# Patient Record
Sex: Male | Born: 1975 | Race: Asian | Hispanic: No | Marital: Married | Smoking: Never smoker
Health system: Southern US, Community
[De-identification: ages and names within clinical notes are randomized; demographics above are authoritative.]

## PROBLEM LIST (undated history)

## (undated) DIAGNOSIS — E785 Hyperlipidemia, unspecified: Secondary | ICD-10-CM

## (undated) DIAGNOSIS — E119 Type 2 diabetes mellitus without complications: Secondary | ICD-10-CM

## (undated) DIAGNOSIS — I10 Essential (primary) hypertension: Secondary | ICD-10-CM

---

## 2001-02-11 ENCOUNTER — Encounter: Admission: RE | Admit: 2001-02-11 | Discharge: 2001-02-11 | Payer: Self-pay | Admitting: Family Medicine

## 2001-02-25 ENCOUNTER — Encounter: Admission: RE | Admit: 2001-02-25 | Discharge: 2001-02-25 | Payer: Self-pay | Admitting: Family Medicine

## 2001-03-25 ENCOUNTER — Encounter: Admission: RE | Admit: 2001-03-25 | Discharge: 2001-03-25 | Payer: Self-pay | Admitting: Family Medicine

## 2006-08-06 ENCOUNTER — Encounter (INDEPENDENT_AMBULATORY_CARE_PROVIDER_SITE_OTHER): Payer: Self-pay | Admitting: *Deleted

## 2007-07-15 ENCOUNTER — Ambulatory Visit: Payer: Self-pay | Admitting: Family Medicine

## 2007-07-15 DIAGNOSIS — F329 Major depressive disorder, single episode, unspecified: Secondary | ICD-10-CM | POA: Insufficient documentation

## 2007-07-15 DIAGNOSIS — R030 Elevated blood-pressure reading, without diagnosis of hypertension: Secondary | ICD-10-CM | POA: Insufficient documentation

## 2007-07-15 DIAGNOSIS — E669 Obesity, unspecified: Secondary | ICD-10-CM | POA: Insufficient documentation

## 2007-07-15 DIAGNOSIS — A630 Anogenital (venereal) warts: Secondary | ICD-10-CM | POA: Insufficient documentation

## 2007-07-15 DIAGNOSIS — B078 Other viral warts: Secondary | ICD-10-CM | POA: Insufficient documentation

## 2007-07-15 LAB — CONVERTED CEMR LAB
Bilirubin Urine: NEGATIVE
Ketones, urine, test strip: NEGATIVE
Specific Gravity, Urine: 1.025
pH: 6

## 2007-08-05 ENCOUNTER — Ambulatory Visit: Payer: Self-pay | Admitting: Family Medicine

## 2007-08-05 DIAGNOSIS — R5383 Other fatigue: Secondary | ICD-10-CM

## 2007-08-05 DIAGNOSIS — K219 Gastro-esophageal reflux disease without esophagitis: Secondary | ICD-10-CM | POA: Insufficient documentation

## 2007-08-05 DIAGNOSIS — R5381 Other malaise: Secondary | ICD-10-CM | POA: Insufficient documentation

## 2007-08-05 LAB — CONVERTED CEMR LAB
ALT: 155 units/L — ABNORMAL HIGH (ref 0–53)
Albumin: 4.8 g/dL (ref 3.5–5.2)
Alkaline Phosphatase: 110 units/L (ref 39–117)
CO2: 22 meq/L (ref 19–32)
Glucose, Bld: 97 mg/dL (ref 70–99)
MCHC: 34.1 g/dL (ref 30.0–36.0)
MCV: 94.1 fL (ref 78.0–100.0)
Platelets: 274 10*3/uL (ref 150–400)
Potassium: 4.3 meq/L (ref 3.5–5.3)
RBC: 4.9 M/uL (ref 4.22–5.81)
Sodium: 141 meq/L (ref 135–145)
Total Bilirubin: 0.7 mg/dL (ref 0.3–1.2)
Total Protein: 7.5 g/dL (ref 6.0–8.3)
WBC: 6.9 10*3/uL (ref 4.0–10.5)

## 2007-08-12 ENCOUNTER — Encounter: Payer: Self-pay | Admitting: Family Medicine

## 2007-09-09 ENCOUNTER — Ambulatory Visit: Payer: Self-pay | Admitting: Family Medicine

## 2007-09-09 DIAGNOSIS — R740 Nonspecific elevation of levels of transaminase and lactic acid dehydrogenase [LDH]: Secondary | ICD-10-CM

## 2007-09-09 DIAGNOSIS — R7401 Elevation of levels of liver transaminase levels: Secondary | ICD-10-CM | POA: Insufficient documentation

## 2007-09-09 LAB — CONVERTED CEMR LAB
ALT: 105 units/L — ABNORMAL HIGH (ref 0–53)
AST: 43 units/L — ABNORMAL HIGH (ref 0–37)
Albumin: 5 g/dL (ref 3.5–5.2)
Alkaline Phosphatase: 114 units/L (ref 39–117)
HDL: 42 mg/dL (ref >39)
Hep B C IgM: NEGATIVE
Hepatitis B Surface Ag: NEGATIVE
LDL Cholesterol: 124 mg/dL — ABNORMAL HIGH (ref 0–99)
Total Bilirubin: 0.5 mg/dL (ref 0.3–1.2)
Total CHOL/HDL Ratio: 4.7
Total Protein: 7.9 g/dL (ref 6.0–8.3)

## 2007-09-12 ENCOUNTER — Encounter: Payer: Self-pay | Admitting: Family Medicine

## 2012-02-28 ENCOUNTER — Emergency Department (HOSPITAL_COMMUNITY): Payer: No Typology Code available for payment source

## 2012-02-28 ENCOUNTER — Encounter (HOSPITAL_COMMUNITY): Payer: Self-pay

## 2012-02-28 ENCOUNTER — Emergency Department (HOSPITAL_COMMUNITY)
Admission: EM | Admit: 2012-02-28 | Discharge: 2012-02-28 | Disposition: A | Discharge status: Home / Self Care | Payer: No Typology Code available for payment source | Attending: Emergency Medicine | Admitting: Emergency Medicine

## 2012-02-28 DIAGNOSIS — S161XXA Strain of muscle, fascia and tendon at neck level, initial encounter: Secondary | ICD-10-CM

## 2012-02-28 DIAGNOSIS — S139XXA Sprain of joints and ligaments of unspecified parts of neck, initial encounter: Secondary | ICD-10-CM | POA: Insufficient documentation

## 2012-02-28 DIAGNOSIS — Y9389 Activity, other specified: Secondary | ICD-10-CM | POA: Insufficient documentation

## 2012-02-28 DIAGNOSIS — S8012XA Contusion of left lower leg, initial encounter: Secondary | ICD-10-CM

## 2012-02-28 DIAGNOSIS — S8010XA Contusion of unspecified lower leg, initial encounter: Secondary | ICD-10-CM | POA: Insufficient documentation

## 2012-02-28 DIAGNOSIS — Y92410 Unspecified street and highway as the place of occurrence of the external cause: Secondary | ICD-10-CM | POA: Insufficient documentation

## 2012-02-28 MED ORDER — IBUPROFEN 400 MG PO TABS
600.0000 mg | ORAL_TABLET | Freq: Once | ORAL | Status: AC
  Administered 2012-02-28: 600 mg via ORAL
  Filled 2012-02-28: qty 1
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
## 2012-08-16 ENCOUNTER — Encounter: Payer: Self-pay | Admitting: Family Medicine

## 2012-08-16 ENCOUNTER — Ambulatory Visit (INDEPENDENT_AMBULATORY_CARE_PROVIDER_SITE_OTHER): Payer: No Typology Code available for payment source | Admitting: Family Medicine

## 2012-08-16 VITALS — BP 125/80 | HR 59 | Ht 64.0 in | Wt 197.0 lb

## 2012-08-16 DIAGNOSIS — R7401 Elevation of levels of liver transaminase levels: Secondary | ICD-10-CM

## 2012-08-16 DIAGNOSIS — E669 Obesity, unspecified: Secondary | ICD-10-CM

## 2012-08-16 DIAGNOSIS — H109 Unspecified conjunctivitis: Secondary | ICD-10-CM

## 2012-08-16 DIAGNOSIS — R7402 Elevation of levels of lactic acid dehydrogenase (LDH): Secondary | ICD-10-CM

## 2012-08-16 LAB — COMPREHENSIVE METABOLIC PANEL
Alkaline Phosphatase: 78 U/L (ref 39–117)
BUN: 12 mg/dL (ref 6–23)
Creat: 0.77 mg/dL (ref 0.50–1.35)
Glucose, Bld: 102 mg/dL — ABNORMAL HIGH (ref 70–99)
Sodium: 137 mEq/L (ref 135–145)
Total Bilirubin: 0.6 mg/dL (ref 0.3–1.2)
Total Protein: 6.9 g/dL (ref 6.0–8.3)

## 2012-08-16 LAB — LIPID PANEL
HDL: 40 mg/dL (ref >39)
LDL Cholesterol: 110 mg/dL — ABNORMAL HIGH (ref 0–99)
Total CHOL/HDL Ratio: 4.8 Ratio
Triglycerides: 204 mg/dL — ABNORMAL HIGH (ref <150)
VLDL: 41 mg/dL — ABNORMAL HIGH (ref 0–40)

## 2012-08-16 NOTE — Patient Instructions (Signed)
We are going to check your lab work and I will call you if anything is abnormal.

## 2012-08-18 ENCOUNTER — Encounter: Payer: Self-pay | Admitting: Family Medicine

## 2012-08-18 DIAGNOSIS — H109 Unspecified conjunctivitis: Secondary | ICD-10-CM | POA: Insufficient documentation

## 2012-08-18 NOTE — Assessment & Plan Note (Signed)
Obtain lipid panel and CMP today.

## 2012-08-18 NOTE — Assessment & Plan Note (Signed)
Repeat LFTs today.

## 2012-08-18 NOTE — Progress Notes (Signed)
Patient ID: Allen Rose, male   DOB: Feb 03, 1975, 37 y.o.   MRN: [id number]  Chief Complaint  Patient presents with  . NP   HPI Allen Rose is a 37 y.o. male.  Patient presents as a new patient.  He denies any major concern other than right eye pain and redness for which he was seen by Dr. Gwen Pounds with ophthalmology. Symptoms have been present for 2 months. No trauma to eye. Some occasional blurred vision but other than that, no difficulty seeing. No associated headache. Some clear drainage. He has been using bromfenac ophthalmic eyedrops that were prescribed which have helped. He was told this could be a viral infection.    History reviewed. No pertinent past medical history.  History reviewed. No pertinent past surgical history.  Family History  Problem Relation Age of Onset  . Diabetes Sister     Social History History  Substance Use Topics  . Smoking status: Never Smoker   . Smokeless tobacco: Not on file  . Alcohol Use: Yes     Comment: rarely: 2-4 times per month    No Known Allergies  No current outpatient prescriptions on file.   No current facility-administered medications for this visit.    Review of Systems Review of Systems Review of Systems - General ROS: negative for - chills, fever or weight loss Ophthalmic ROS: negative for - decreased vision, double vision or otherwise: see HPI Respiratory ROS: no cough, shortness of breath, or wheezing Cardiovascular ROS: no chest pain or dyspnea on exertion, no lower extremity edema Gastrointestinal ROS: no abdominal pain, change in bowel habits, or black or bloody stools Genito-Urinary ROS: no dysuria, trouble voiding, or hematuria Musculoskeletal ROS: negative for - joint pain or joint swelling Dermatological ROS: negative for rash  Blood pressure 125/80, pulse 59, height 5\' 4"  (1.626 m), weight 197 lb (89.359 kg).  Physical Exam Physical Exam Physical Examination: General appearance - alert, well  appearing, and in no distress Mental status - alert, oriented to person, place, and time Eyes - pupils equal and reactive, extraocular movements intact RIght eye: lacrimation present with injected sclera. Normal on left.  Nose - normal and patent, no erythema, discharge or polyps Mouth - mucous membranes moist, pharynx normal without lesions Neck - supple, no significant adenopathy Chest - clear to auscultation, no wheezes, rales or rhonchi, symmetric air entry Heart - normal rate, regular rhythm, normal S1, S2, no murmurs, rubs, clicks or gallops Abdomen - soft, nontender, nondistended, no masses or organomegaly Extremities - peripheral pulses normal, no pedal edema    Assessment    See problem list    Plan    See problem list       Dennies Coate 08/18/2012, 3:59 PM

## 2012-08-18 NOTE — Assessment & Plan Note (Signed)
Right eye conjunctivitis, unclear etiology. Seen by ophthalmology but patient can no longer afford visits without insurance. Will refer him to ophthalmology through the orange card so that he may be seen.

## 2012-08-19 ENCOUNTER — Encounter: Payer: Self-pay | Admitting: Family Medicine

## 2012-08-23 ENCOUNTER — Telehealth: Payer: Self-pay | Admitting: Family Medicine

## 2012-08-23 NOTE — Telephone Encounter (Signed)
What does he needs to know?   Phong Isenberg, Darlyne Russian, CMA

## 2012-08-23 NOTE — Telephone Encounter (Signed)
Pt called in referent  To his referral  To the eyes doctor.  Marines

## 2012-08-26 NOTE — Telephone Encounter (Signed)
On last visit pt mention to DR some eyes problem and needs his referral pt has OC.  Marines

## 2012-08-26 NOTE — Telephone Encounter (Signed)
It has been faxed to [facility].  It can take 3 to 6 months before he will get an appointment.  Ileana Ladd

## 2012-08-26 NOTE — Telephone Encounter (Signed)
Lupita Leash,  I see referral placed 08/16/12, where does it stand?  Rozanna Cormany, Darlyne Russian, CMA

## 2012-08-27 NOTE — Telephone Encounter (Signed)
Thank You I will let pt know  Marines

## 2012-08-27 NOTE — Telephone Encounter (Signed)
Will fwd to [employer] to please tell pt.  See msg below.  Allen Rose, Darlyne Russian, CMA

## 2012-11-18 ENCOUNTER — Ambulatory Visit (INDEPENDENT_AMBULATORY_CARE_PROVIDER_SITE_OTHER): Payer: No Typology Code available for payment source | Admitting: *Deleted

## 2012-11-18 DIAGNOSIS — Z23 Encounter for immunization: Secondary | ICD-10-CM

## 2013-01-31 ENCOUNTER — Encounter: Payer: Self-pay | Admitting: Family Medicine

## 2013-01-31 ENCOUNTER — Ambulatory Visit (INDEPENDENT_AMBULATORY_CARE_PROVIDER_SITE_OTHER): Payer: No Typology Code available for payment source | Admitting: Family Medicine

## 2013-01-31 VITALS — BP 130/86 | HR 66 | Temp 98.3°F | Ht 64.0 in | Wt 198.0 lb

## 2013-01-31 DIAGNOSIS — K029 Dental caries, unspecified: Secondary | ICD-10-CM

## 2013-01-31 MED ORDER — IBUPROFEN 600 MG PO TABS: 600.0000 mg | ORAL_TABLET | Freq: Four times a day (QID) | ORAL | Status: DC | PRN

## 2013-01-31 MED ORDER — PENICILLIN V POTASSIUM 500 MG PO TABS: 500.0000 mg | ORAL_TABLET | Freq: Four times a day (QID) | ORAL | Status: DC

## 2013-01-31 NOTE — Patient Instructions (Signed)
You can use Ora Gel for the pain.  We will put in a referral for a dentist.   Caries dental  (Dental Caries) La caries dental es la ms comn de todas las enfermedades de la boca. Ocurre en todas las edades, pero es ms frecuente en nios y Campbell.  CMO SE DESARROLLA LA CARIES DENTAL  El proceso de caries comienza cuando las bacterias de la boca se combinan con los alimentos, (especialmente azcares y almidones) para Air cabin crew. La placa es una sustancia que se adhiere a las superficies duras de los dientes (Engineer, structural). Las bacterias de la placa producen cidos que atacan el esmalte de los dientes. Estos cidos tambin pueden atacar la superficie de la raz de un diente (cemento) si este est expuesto. Los ataques repetidos disuelven estas superficies y crean huecos en el diente (cavidades). Si no se tratan, los cidos [employer] capas del diente.  FACTORES DE RIESGO   El consumo frecuente de bebidas azucaradas.   El consumo frecuente de alimentos que contienen azcar y almidn y de aquellos que se quedan fcilmente adheridos a los dientes.   Higiene bucal deficiente.   Sequedad en la boca.   Abuso de sustancias como metanfetaminas.   Arreglos dentales en mal estado o mal hechos.   Trastornos de Psychologist, sport and exercise.   Reflujo gastroesofgico (ERGE).   Ciertos tratamientos de radiacin en la cabeza y el cuello. SNTOMAS  En las etapas tempranas de la caries dental, rara vez hay sntomas. En algunos casos pueden observarse zonas blancas, con aspecto de tiza, Campbell [employer] u otras capas del diente. En las etapas posteriores, los sntomas incluyen:   Hoyos y [employer].  Dolor en los dientes despus de consumir alimentos o bebidas dulces, calientes o fros.  Dolor alrededor del diente.  Inflamacin alrededor del diente. DIAGNSTICO  La mayora de las veces, la caries dental se detecta durante un control habitual. El diagnstico se realiza  despus hacer de una detallada historia mdica y odontolgica y de la observacin de las superficies de los dientes buscando signos de caries dental. En algunos casos se utilizan instrumentos especiales, como rayos lser, para buscar caries dentales. Podrn tomarle radiografas dentales de modo que puedan buscarse caries que no se observan a simple vista (como entre las zonas de [employer]).  TRATAMIENTO  Si la caries dental se encuentra en una etapa temprana, podr revertirse con un tratamiento con flor o la aplicacin de un agente remineralizante en el consultorio del dentista. Es necesario un buen cepillado y Sargeant del hilo dental para ayudar a estos tratamientos. Si est en etapas ms avanzadas, el tratamiento depender de la ubicacin y la extensin de la destruccin dental:   Si se ha destruido una pequea zona del diente, la zona ser removida y las cavidades se llenarn con un material como una amalgama de oro o plata o un compuesto de resina.   Si se ha destruido una zona grande del diente, la zona destruida ser removida y se Scientific laboratory technician una cubierta (corona) sobre la estructura que quede del diente.   Si est afectada la parte central del diente (pulpa), ser necesario realizar un procedimiento llamado tratamiento de conducto antes de llenar la cavidad o colocar una corona.   Si la mayor parte del diente est destruido, ser necesario extirpar Barista (extraerlo). INSTRUCCIONES PARA EL CUIDADO EN EL HOGAR  Podr evitar, detener o revertir las caries dentales en su casa, con una buena  higiene bucal. La buena higiene bucal incluye:   Una buena higiene de los dientes al Borders Groupmenos dos veces por da con cepillo e hilo dental.   Use una pasta dental con flor. Tambin puede usar un enjuague dental con flor si se lo recomienda el odontlogo o el mdico.   Limite la cantidad de alimentos y bebidas que contengan azcar y almidones que consume.   Evite el consumo frecuente de  estos alimentos y bebidas.   Cumpla con las visitas a un dentista para realizar controles y limpieza regulares. PREVENCIN   Mantenga una buena higiene bucal.  Considere un sellador dental. Un sellador dental es un revestimiento de material que aplica el dentista a las muescas y Emerson Electrichuecos de los dientes. El sellador impide que los alimentos queden atrapados en los huecos. Puede proteger a los Print production plannerdientes durante varios aos.  Pida suplementos con flor si vive en una comunidad cuya agua no tiene flor o con agua que tenga bajo contenido de flor. Use suplementos de flor segn las indicaciones del odontlogo o el mdico.  Permita las aplicaciones de flor en los dientes si se lo indica el odontlogo o el mdico. Document Released: 01/02/2005 Document Revised: 09/04/2012 [facility] Patient Information 2014 [employer], [place].

## 2013-02-01 DIAGNOSIS — K029 Dental caries, unspecified: Secondary | ICD-10-CM | POA: Insufficient documentation

## 2013-02-01 NOTE — Assessment & Plan Note (Signed)
Tooth pain very likely from carrie.  - prophylactically treat with pen v k in case he is unable to see dentist soon - dental referral with orange card - ibuprofen 600mg  q6/prn for pain  - oragel  - return to care if worsening pain, fevers, chills, swelling

## 2013-02-01 NOTE — Progress Notes (Signed)
Patient ID: Fredi Reyes-Menjivar    DOB: 05/25/1975, 38 y.o.   MRN: [id number] --- Subjective:  Elita QuickJose is a 38 y.o.male who presents with dental pain. Pain is located on the left lower molar. Started 1 week ago. Worst with cold and hot foods. Worst with chewing and eating. No fevers, no chills. Has not seen dentist in several years. Has not taken any medicine for it other than Listerine which helps.   ROS: see HPI Past Medical History: reviewed and updated medications and allergies. Social History: Tobacco: none  Objective: Filed Vitals:   01/31/13 0854  BP: 130/86  Pulse: 66  Temp: 98.3 F (36.8 C)    Physical Examination:   General appearance - alert, well appearing, and in no distress Mouth - left lower 2nd molar with carrie, mild tenderness with tapping of tooth, no soft tissue swelling No cervical lymphadenopathy

## 2013-07-10 ENCOUNTER — Ambulatory Visit: Payer: No Typology Code available for payment source

## 2013-07-11 ENCOUNTER — Ambulatory Visit (INDEPENDENT_AMBULATORY_CARE_PROVIDER_SITE_OTHER): Payer: No Typology Code available for payment source | Admitting: Family Medicine

## 2013-07-11 ENCOUNTER — Encounter: Payer: Self-pay | Admitting: Family Medicine

## 2013-07-11 VITALS — BP 143/88 | HR 72 | Temp 98.2°F | Ht 64.0 in | Wt 192.0 lb

## 2013-07-11 DIAGNOSIS — K029 Dental caries, unspecified: Secondary | ICD-10-CM

## 2013-07-11 DIAGNOSIS — R5383 Other fatigue: Principal | ICD-10-CM

## 2013-07-11 DIAGNOSIS — R5381 Other malaise: Secondary | ICD-10-CM

## 2013-07-11 DIAGNOSIS — L83 Acanthosis nigricans: Secondary | ICD-10-CM

## 2013-07-11 DIAGNOSIS — R300 Dysuria: Secondary | ICD-10-CM

## 2013-07-11 LAB — POCT URINALYSIS DIPSTICK
BILIRUBIN UA: NEGATIVE
Glucose, UA: NEGATIVE
Ketones, UA: NEGATIVE
Leukocytes, UA: NEGATIVE
NITRITE UA: NEGATIVE
PH UA: 6
RBC UA: NEGATIVE
Spec Grav, UA: >=1.03
Urobilinogen, UA: 0.2

## 2013-07-11 LAB — COMPREHENSIVE METABOLIC PANEL
ALBUMIN: 4.2 g/dL (ref 3.5–5.2)
ALT: 47 U/L (ref 0–53)
AST: 26 U/L (ref 0–37)
Alkaline Phosphatase: 78 U/L (ref 39–117)
BUN: 15 mg/dL (ref 6–23)
CALCIUM: 9.4 mg/dL (ref 8.4–10.5)
CHLORIDE: 103 meq/L (ref 96–112)
CO2: 25 meq/L (ref 19–32)
Creat: 0.68 mg/dL (ref 0.50–1.35)
GLUCOSE: 103 mg/dL — AB (ref 70–99)
POTASSIUM: 4.1 meq/L (ref 3.5–5.3)
SODIUM: 142 meq/L (ref 135–145)
TOTAL PROTEIN: 7.1 g/dL (ref 6.0–8.3)
Total Bilirubin: 0.6 mg/dL (ref 0.2–1.2)

## 2013-07-11 LAB — POCT GLYCOSYLATED HEMOGLOBIN (HGB A1C): HEMOGLOBIN A1C: 6.4

## 2013-07-11 LAB — GLUCOSE, CAPILLARY: Glucose-Capillary: 106 mg/dL — ABNORMAL HIGH (ref 70–99)

## 2013-07-11 LAB — CBC
HCT: 40.5 % (ref 39.0–52.0)
Hemoglobin: 14.2 g/dL (ref 13.0–17.0)
MCH: 31.4 pg (ref 26.0–34.0)
MCHC: 35.1 g/dL (ref 30.0–36.0)
MCV: 89.6 fL (ref 78.0–100.0)
PLATELETS: 354 10*3/uL (ref 150–400)
RBC: 4.52 MIL/uL (ref 4.22–5.81)
RDW: 13.2 % (ref 11.5–15.5)
WBC: 7.9 10*3/uL (ref 4.0–10.5)

## 2013-07-11 LAB — CK: CK TOTAL: 76 U/L (ref 7–232)

## 2013-07-11 LAB — TSH: TSH: 0.05 u[IU]/mL — AB (ref 0.350–4.500)

## 2013-07-11 LAB — T4, FREE: Free T4: 1.87 ng/dL — ABNORMAL HIGH (ref 0.80–1.80)

## 2013-07-11 MED ORDER — ACETAMINOPHEN-CODEINE #3 300-30 MG PO TABS: 2.0000 | ORAL_TABLET | ORAL | Status: DC | PRN

## 2013-07-11 NOTE — Assessment & Plan Note (Signed)
Patient with generalized weakness and aches/pain; also with anterior neck tenderness that is worse with coughing.  Suspect non-suppurative thyroiditis by clinical presentation, but will check TSH and free T4 as well. Also checking CK and CBC.  If TFTs normal, consider thyroid US.  Will call patient at cell [phone] with results.

## 2013-07-11 NOTE — Progress Notes (Signed)
   Subjective:    Patient ID: Allen Rose, male    DOB: 03/20/1975, 38 y.o.   MRN: [id number]  HPI Visit in Spanish. Patient here for several complaints:   1. Generalized malaise and body soreness involving both arms and legs, for the past 8 days. Does not reach to distal-most aspects of extremities (does not involve hands or feet). No changes in activity patterns, other than a decrease in exercise due to generalized fatigue. No fevers or chills, no skin changes, no cough, no shortness of breath.   2. Painful left inferior molar, relieves with Tylenol 500mg  2 tablets two or three times a day. Has been given a referral for dentistry but has not come up for an appointment in the queue.  Has taken Advil 3 tablets as needed when the pain gets more severe.  Again, denies fever or chills.   3. Has had complaint of pain along the anterior aspect of the neck, worse when swallowing food and not having it if he swallows thin liquids like water. Also tender when he touches his neck.  Onset in the past week or so.   Social Hx; Quit smoking 8 years ago (previously smoked 3 cigarettes/day).  Drinks a single drink of liquor at social gatherings a few times a year, does not drink beer or wine regularly. 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  No other changes in activity.   Family Hx; Sister with diabetes mellitus Type 2.  No known cardiac disease.    Review of Systems Generalized fatigue and proximal muscle pains; no fevers or chills, no chest pain. No cough or dyspnea. Has had some increased thirst and mild increase in urinary frequency when he drinks a lot of water. No nausea/vomiting.     Objective:   Physical Exam  Generally well-appearing, no apparent distress HEENT Neck supple. Poor dentition, without gingival erythema or purulence. Clear oropharynx. Moist mucus membranes. No cervical adenopathy. Mildly tender thyroid gland on palpation. TMs clear bilaterally. No frontal or maxillary tenderness.  COR Regular  S1S2 PULM Clear bilaterally.  SKIN: NOtable acanthosis nigricans on neck line.  EXTS: no lower extremity edema, no foot lesions on inspection NEURO: Gait unremarkable. Strength in UE and LE full and symmetric bilaterally.        Assessment & Plan:

## 2013-07-11 NOTE — Patient Instructions (Signed)
Fue un Research officer, trade unionplacer verle hoy.  Estoy Air Products and Chemicalsmandando hacer una serie de examenes para investigar las posibles causas de los dolores y el decaimiento que siente.   Le llamo al [phone] con [phone] Homesteadresultados.   Para el dolor de Dustinmuela, ya tiene un referido para Office managerel dentista.  Puede tomar la Tylenol cada 6 horas como esta' haciendo.  Le estoy recetando Tylenol con Codeina ("Tylenol #3"), para tomar 1 a 2 tabletas cada 6 horas segun necesite. Es un narcotico que le puede dar sueno, asi que no la tome junto con alcohol ni con otras medicinas.   FOLLOW UP APPOINTMENT IN 1 TO 2 WEEKS.

## 2013-07-14 ENCOUNTER — Telehealth: Payer: Self-pay | Admitting: Family Medicine

## 2013-07-14 DIAGNOSIS — E060 Acute thyroiditis: Secondary | ICD-10-CM

## 2013-07-14 MED ORDER — NAPROXEN 500 MG PO TABS: 500.0000 mg | ORAL_TABLET | Freq: Two times a day (BID) | ORAL | Status: DC

## 2013-07-14 NOTE — Telephone Encounter (Signed)
Called patient, call completed in Spanish.  Patient with thyroid tenderness on exam, and low TSH and high free T4.  Clinical presentation of non-suppurative thyroiditis.  Plan to start on scheduled naproxen 500mg  twice daily for the coming 5 to 10 days and then prn thereafter.  He is to call back before July 3 if not continuing to improve. JB

## 2013-09-19 ENCOUNTER — Encounter: Payer: Self-pay | Admitting: Family Medicine

## 2013-09-19 ENCOUNTER — Ambulatory Visit (INDEPENDENT_AMBULATORY_CARE_PROVIDER_SITE_OTHER): Payer: No Typology Code available for payment source | Admitting: Family Medicine

## 2013-09-19 VITALS — BP 130/80 | HR 74 | Temp 98.2°F | Ht 64.0 in | Wt 201.4 lb

## 2013-09-19 DIAGNOSIS — A630 Anogenital (venereal) warts: Secondary | ICD-10-CM

## 2013-09-19 DIAGNOSIS — K2100 Gastro-esophageal reflux disease with esophagitis, without bleeding: Secondary | ICD-10-CM

## 2013-09-19 DIAGNOSIS — E060 Acute thyroiditis: Secondary | ICD-10-CM

## 2013-09-19 DIAGNOSIS — L83 Acanthosis nigricans: Secondary | ICD-10-CM

## 2013-09-19 LAB — T3, FREE: T3 FREE: 3.2 pg/mL (ref 2.3–4.2)

## 2013-09-19 LAB — TSH: TSH: 5.685 u[IU]/mL — ABNORMAL HIGH (ref 0.350–4.500)

## 2013-09-19 LAB — T4, FREE: FREE T4: 0.93 ng/dL (ref 0.80–1.80)

## 2013-09-19 LAB — C-REACTIVE PROTEIN: CRP: <0.5 mg/dL (ref <0.60)

## 2013-09-19 MED ORDER — OMEPRAZOLE 20 MG PO CPDR: 20.0000 mg | DELAYED_RELEASE_CAPSULE | Freq: Every day | ORAL | Status: DC

## 2013-09-19 NOTE — Progress Notes (Signed)
   Subjective:    Patient ID: Rondle Lohse, male    DOB: [birth year], 38 y.o.   MRN: [id number]  HPI Visit in Spanish. Patient for follow up of acute non-suppurative thyroiditis that was diagnosed by labs and clinical presentation in June. He reports that the fatigue and achiness is much better/resolved.  Does have intermittent throat pain that is associated with hot and spicy foods.  May be associated with epigastric discomfort as well. No nausea or vomiting. Not taking anything for it. Stopped the Naproxen and Tylenol when the body aches resolved.   Reviewed his labs during visit; had negative DM screen with A1C.    He has gained 10 lbs since last visit. Nonsmoker, (quit 8 yrs ago).  Drinks 2 beers/month on average. No sodas. Exercises at the gym.   At end of visit, raises issue of genital warts that he would like to have removed/treated with cryo. No dysuria or penile discharge.  Review of Systems     Objective:   Physical Exam Well appearing, no apparent distress HEENT neck supple, no thyroid nodularity or tenderness. Clear oropharynx. TMs clear bilaterally. MMM COR regular S1S2, no extra sounds PULM Clear bilaterally, no rales or wheezes EXTS no lower extremity edema.  GU: condylomas measuring <1cm along suprapubic area.       Assessment & Plan:

## 2013-09-19 NOTE — Assessment & Plan Note (Signed)
Plan to address with cryo/liquid Nitrogen at subsequent visit. Consider STI screening.

## 2013-09-19 NOTE — Assessment & Plan Note (Signed)
Screening for DM at last visit, negative. For annual repeat screenings in light of obesity and acanthosis nigricans.

## 2013-09-19 NOTE — Patient Instructions (Addendum)
Fue un Research officer, trade union.  Me alegro que las dolencias estan mejores.   Estamos chequeando los laboratorios para la tiroide Network engineer, y Garment/textile technologist con los resultados en la semana que viene.  Quisiera verle de nuevo para [employer] tiene.  Omeprazole  diario para reflujo, que creo que es la razon por el ardor en la garganta.  FOLLOW UP APPOINTMENT FOR WART REMOVAL WITH DR Mauricio Po.

## 2013-09-19 NOTE — Assessment & Plan Note (Signed)
Repeat TSH today, symptomatically improved/resolved.

## 2013-09-23 ENCOUNTER — Telehealth: Payer: Self-pay | Admitting: Family Medicine

## 2013-09-23 NOTE — Telephone Encounter (Signed)
Called patient to inform of lab results, call in Spanish. Patient feels well. Discussed his mildly elevated TSH and normal free T3/T4, plan to recheck the TFTs again in 3-4 months to see if normalized.  He just started omeprazole  daily for suspected GERD causing sore throat after eating; he believes it may be making him slightly better.  Plan for wart removal appt in first week of October.  Paula Compton, MD

## 2013-10-17 ENCOUNTER — Ambulatory Visit (INDEPENDENT_AMBULATORY_CARE_PROVIDER_SITE_OTHER): Payer: No Typology Code available for payment source | Admitting: Family Medicine

## 2013-10-17 ENCOUNTER — Encounter: Payer: Self-pay | Admitting: Family Medicine

## 2013-10-17 VITALS — BP 117/75 | HR 69 | Temp 99.1°F | Ht 64.0 in | Wt 198.4 lb

## 2013-10-17 DIAGNOSIS — A630 Anogenital (venereal) warts: Secondary | ICD-10-CM

## 2013-10-17 DIAGNOSIS — B078 Other viral warts: Secondary | ICD-10-CM

## 2013-10-17 DIAGNOSIS — K219 Gastro-esophageal reflux disease without esophagitis: Secondary | ICD-10-CM

## 2013-10-17 NOTE — Assessment & Plan Note (Signed)
Resolved while on omeprazole 20mg  daily. Plan to complete another 2 weeks (total 6 weeks) and then hold.  May resume if sx recur.

## 2013-10-17 NOTE — Progress Notes (Signed)
   Subjective:    Patient ID: Allen Rose, male    DOB: 12/08/1975, 38 y.o.   MRN: [id number]  HPI Visit in Spanish.  Patient here for follow up of suprapubic warts.  Has had them for over 8 years. Do not cause pain. Also with a skin tag on abdomen and common wart on PIP joint L fifth finger.   Reports that the omeprazole daily has helped resolve his GERD symptoms. Has been taking for about 1 month now.    Review of Systems     Objective:   Physical Exam Well appearing, no apparent distress HEENT Neck supple.  SKIN: pedunculated fleshy skin tag on abdomen. Suprapubic warts (4) measuring from 2mm to 8mm in diameter. None on phallus or scrotum.  Common wart on PIP joint of L finger.       Assessment & Plan:  Procedure note: Removal of 4 genital warts, one skin tag R abdomen, L fifth digit common wart.  Dermablade, liquid N2 (hand/finger wart). Discussed risks and benefits of wart removal in office, patient gives verbal consent to proceed.  Applied liquid nitrogen to the four suprapubic warts. Following application of liquid N2, infiltrated base of warts in suprapubic region with lidocaine with epinephrine, cleaned and prepped with betadine. Dermablade to shave the four warts in suprapubic region. Silver nitrate applied for cautery, EBL zero. Topical antibiotics and gauze.   Suture removal scissors to clip pedunculated skin tag on R mid-abdomen.   Cryo (liquid nitrogen) applied to common wart on L fifth finger.   No specimens for pathology.  Tolerated well.

## 2013-10-17 NOTE — Assessment & Plan Note (Signed)
Common wart, frozen with liquid nitrogen today. Follow up as needed.

## 2013-10-17 NOTE — Assessment & Plan Note (Signed)
Shave removal of 4 suprapubic warts, well tolerated. Discussed possibility of recurrence and patient is to follow up if recurs. Post-procedure care discussed with patient.

## 2013-12-01 ENCOUNTER — Ambulatory Visit: Payer: Self-pay

## 2014-02-24 ENCOUNTER — Ambulatory Visit: Payer: Self-pay | Admitting: Family Medicine

## 2014-02-27 ENCOUNTER — Other Ambulatory Visit (HOSPITAL_COMMUNITY)
Admission: RE | Admit: 2014-02-27 | Discharge: 2014-02-27 | Disposition: A | Discharge status: Home / Self Care | Payer: 59 | Source: Ambulatory Visit | Attending: Family Medicine | Admitting: Family Medicine

## 2014-02-27 ENCOUNTER — Ambulatory Visit (INDEPENDENT_AMBULATORY_CARE_PROVIDER_SITE_OTHER): Payer: 59 | Admitting: Family Medicine

## 2014-02-27 ENCOUNTER — Encounter: Payer: Self-pay | Admitting: Family Medicine

## 2014-02-27 VITALS — BP 133/84 | HR 58 | Temp 98.1°F | Ht 64.0 in | Wt 201.0 lb

## 2014-02-27 DIAGNOSIS — Z113 Encounter for screening for infections with a predominantly sexual mode of transmission: Secondary | ICD-10-CM | POA: Insufficient documentation

## 2014-02-27 DIAGNOSIS — E060 Acute thyroiditis: Secondary | ICD-10-CM

## 2014-02-27 DIAGNOSIS — H538 Other visual disturbances: Secondary | ICD-10-CM

## 2014-02-27 DIAGNOSIS — R7301 Impaired fasting glucose: Secondary | ICD-10-CM | POA: Insufficient documentation

## 2014-02-27 DIAGNOSIS — R300 Dysuria: Secondary | ICD-10-CM

## 2014-02-27 DIAGNOSIS — A630 Anogenital (venereal) warts: Secondary | ICD-10-CM

## 2014-02-27 LAB — COMPREHENSIVE METABOLIC PANEL
ALT: 116 U/L — ABNORMAL HIGH (ref 0–53)
AST: 46 U/L — ABNORMAL HIGH (ref 0–37)
Albumin: 4.2 g/dL (ref 3.5–5.2)
Alkaline Phosphatase: 71 U/L (ref 39–117)
BUN: 11 mg/dL (ref 6–23)
CHLORIDE: 104 meq/L (ref 96–112)
CO2: 28 meq/L (ref 19–32)
CREATININE: 0.71 mg/dL (ref 0.50–1.35)
Calcium: 9.4 mg/dL (ref 8.4–10.5)
Glucose, Bld: 125 mg/dL — ABNORMAL HIGH (ref 70–99)
Potassium: 4.3 mEq/L (ref 3.5–5.3)
Sodium: 140 mEq/L (ref 135–145)
Total Bilirubin: 0.6 mg/dL (ref 0.2–1.2)
Total Protein: 6.9 g/dL (ref 6.0–8.3)

## 2014-02-27 LAB — LIPID PANEL
CHOLESTEROL: 206 mg/dL — AB (ref 0–200)
HDL: 41 mg/dL (ref >39)
LDL Cholesterol: 124 mg/dL — ABNORMAL HIGH (ref 0–99)
TRIGLYCERIDES: 204 mg/dL — AB (ref <150)
Total CHOL/HDL Ratio: 5 Ratio
VLDL: 41 mg/dL — ABNORMAL HIGH (ref 0–40)

## 2014-02-27 LAB — POCT URINALYSIS DIPSTICK
BILIRUBIN UA: NEGATIVE
Glucose, UA: NEGATIVE
Ketones, UA: NEGATIVE
Leukocytes, UA: NEGATIVE
Nitrite, UA: NEGATIVE
PH UA: 6
Protein, UA: NEGATIVE
RBC UA: NEGATIVE
Spec Grav, UA: 1.015
Urobilinogen, UA: 0.2

## 2014-02-27 LAB — TSH: TSH: 3.863 u[IU]/mL (ref 0.350–4.500)

## 2014-02-27 NOTE — Patient Instructions (Signed)
Fue un Research officer, trade unionplacer verle hoy.  Estoy haciendo un referido al [facility] para evaluar este problema.   Ademas, estamos haciendo algunos laboratorios en ayunas hoy para Personnel officerchequear el azucar y [place]ol, Australiatambien analisis de orina.  Le notifico de los resultados (cel [phone]).   Quiero verle en 2 semanas.  En todo caso, es importante que trate de disminuir o eliminar el consumo de jugos, sodas, dulces y de Archivistaumentar la fibra vegetal en su dieta.   FOLLOW UP VISIT IN 2 WEEKS WITH DR Mauricio PoBREEN.

## 2014-02-27 NOTE — Assessment & Plan Note (Signed)
UA to rule out UTI.  Also for dirty-catch urine to screen for STI. May be that his sxs are related to polyuria associated with elevated glucose, for follow up in 2 weeks for this.

## 2014-02-27 NOTE — Assessment & Plan Note (Signed)
Resolved after shave biopsy last visit.

## 2014-02-27 NOTE — Progress Notes (Signed)
   Subjective:    Patient ID: Allen Rose, male    DOB: 05/27/1975, 39 y.o.   MRN: [id number]  HPI Visit in Spanish.  Patient presents for cc decreased vision in the right eye, blurry for the past 3 years.  Has been seen by [facility] [address], tel [phone] in the past, but recently obtained insurance and would like a referral.  Denies pain in the eyes, denies seeing stars or floaters.  Does not use corrective eyewear of any kind.   In reviewing his chart, he had an elevated A1C last June 2015, done at that time for finding of acanthosis nigricans.  On ROS he reports some polydipsia and polyuria over the past few months; denies fevers or chills.  Does report some dysuria at the end of void, stronger odor to urine over the past few weeks.   Family hx; Father with alcoholism and liver disease; sister with DM diagnosed in her mid-30s. No known cardiovascular disease noted in family. No stroke history in family.   Social Hx; Former smoker, quit 10 yrs ago. Drinks approximately 18 beers/month, usually in 4 to 8-beer increments. Married, only sexual partner is wife. Does admit to prior partner about 2 years ago.     Review of Systems See above. Also, denies chest pain, cough, dyspnea, leg swelling. Had taken omeprazole 20mg  daily, no further GERD symptoms. No blood per rectum, no diarrhea.      Objective:   Physical Exam Well appearing, no apparent distress HEENT PERRL, EOMI. Snellen results noted. Funduscopic exam nondilated without clear evidence of papilledema or neovascularization noted.  Moist mucus membranes. No oral lesions or oropharyngeal lesions. No cervical adenopathy COR Regular S1S2 PULM Clear bilat. No rales or wheezes GU: no urethral erythema.        Assessment & Plan:

## 2014-02-27 NOTE — Assessment & Plan Note (Signed)
Prior elevated A1C 6.4% in June 2015, also with acanthosis nigricans. Family hx DM as well. For A1C and metabolic panel today, as well as fasting lipids (patient is fasting).

## 2014-02-27 NOTE — Assessment & Plan Note (Signed)
Patient at high risk for DM; complains of worsening visual acuity R eye , confirmed with Snellen in office (OD 20/100, OS 20/40 uncorrected). Referral to [facility] [place], where he has been seen before [phone].

## 2014-02-27 NOTE — Addendum Note (Signed)
Addended by: Jennette BillBUSICK, Jeffifer Rabold L on: 02/27/2014 10:06 AM   Modules accepted: Orders

## 2014-02-27 NOTE — Assessment & Plan Note (Signed)
Thyroid supple today on exam.  Patient's last TSH was mildly elevated. For recheck today.

## 2014-03-02 LAB — URINE CYTOLOGY ANCILLARY ONLY
Chlamydia: NEGATIVE
Neisseria Gonorrhea: NEGATIVE

## 2014-03-04 ENCOUNTER — Encounter: Payer: Self-pay | Admitting: Family Medicine

## 2014-03-04 ENCOUNTER — Telehealth: Payer: Self-pay | Admitting: Family Medicine

## 2014-03-04 DIAGNOSIS — R740 Nonspecific elevation of levels of transaminase and lactic acid dehydrogenase [LDH]: Principal | ICD-10-CM

## 2014-03-04 DIAGNOSIS — IMO0002 Reserved for concepts with insufficient information to code with codable children: Secondary | ICD-10-CM | POA: Insufficient documentation

## 2014-03-04 NOTE — Telephone Encounter (Signed)
Called to patient's home number to report lab results, spoke with him in Spanish about the results, especially the ALT/AST 2:1 elevation.  He reports not feeling as well when he drinks beer, is concerned. Plan to refrain from all alcohol for at least 2 weeks and recheck.  If remains elevated, to check viral hep panel and RUQ ultrasound imaging.   JB

## 2014-03-13 ENCOUNTER — Ambulatory Visit: Payer: 59 | Admitting: Family Medicine

## 2014-03-20 ENCOUNTER — Encounter: Payer: Self-pay | Admitting: Family Medicine

## 2014-03-20 ENCOUNTER — Ambulatory Visit (INDEPENDENT_AMBULATORY_CARE_PROVIDER_SITE_OTHER): Payer: 59 | Admitting: Family Medicine

## 2014-03-20 VITALS — BP 139/82 | HR 56 | Temp 98.2°F | Ht 64.0 in | Wt 196.9 lb

## 2014-03-20 DIAGNOSIS — IMO0002 Reserved for concepts with insufficient information to code with codable children: Secondary | ICD-10-CM

## 2014-03-20 DIAGNOSIS — K2100 Gastro-esophageal reflux disease with esophagitis, without bleeding: Secondary | ICD-10-CM

## 2014-03-20 DIAGNOSIS — R7301 Impaired fasting glucose: Secondary | ICD-10-CM

## 2014-03-20 DIAGNOSIS — R740 Nonspecific elevation of levels of transaminase and lactic acid dehydrogenase [LDH]: Secondary | ICD-10-CM

## 2014-03-20 LAB — POCT GLYCOSYLATED HEMOGLOBIN (HGB A1C): Hemoglobin A1C: 6.6

## 2014-03-20 LAB — COMPREHENSIVE METABOLIC PANEL
ALT: 91 U/L — AB (ref 0–53)
AST: 40 U/L — AB (ref 0–37)
Albumin: 4.1 g/dL (ref 3.5–5.2)
Alkaline Phosphatase: 83 U/L (ref 39–117)
BUN: 11 mg/dL (ref 6–23)
CHLORIDE: 107 meq/L (ref 96–112)
CO2: 26 meq/L (ref 19–32)
Calcium: 9.2 mg/dL (ref 8.4–10.5)
Creat: 0.78 mg/dL (ref 0.50–1.35)
Glucose, Bld: 108 mg/dL — ABNORMAL HIGH (ref 70–99)
POTASSIUM: 4.3 meq/L (ref 3.5–5.3)
Sodium: 141 mEq/L (ref 135–145)
Total Bilirubin: 0.6 mg/dL (ref 0.2–1.2)
Total Protein: 6.5 g/dL (ref 6.0–8.3)

## 2014-03-20 LAB — ACUTE HEP PANEL AND HEP B SURFACE AB
HCV AB: NEGATIVE
HEP B C IGM: NONREACTIVE
HEP B S AB: NEGATIVE
Hep A IgM: NONREACTIVE
Hepatitis B Surface Ag: NEGATIVE

## 2014-03-20 MED ORDER — OMEPRAZOLE 20 MG PO CPDR: 20.0000 mg | DELAYED_RELEASE_CAPSULE | Freq: Every day | ORAL | Status: DC

## 2014-03-20 NOTE — Assessment & Plan Note (Signed)
Patient with fasting glucose 125 at last visit; prior A1C 6.4% in July 2015. For recheck of A1C, fasting glucose today as part of CMet (see "elevated transaminases" under problem list).

## 2014-03-20 NOTE — Patient Instructions (Signed)
Fue un Research officer, trade unionplacer verle hoy.   Le llamo al [phone] con [phone].

## 2014-03-20 NOTE — Progress Notes (Signed)
   Subjective:    Patient ID: Allen Rose, male    DOB: 09/18/1975, 39 y.o.   MRN: [id number]  HPI Visit conducted in Spanish.  Patient seen today in follow up for elevated transaminases and elevated fasting glucose. He reports feeling well, no complaints. He has refrained from alcohol use since learning of the elevated transminases on his last labs (per our phone call).   He does not smoke cigarettes. Reports that he used to drink more alcohol than he does at present; estimates that he drinks 6-7 beers one time weekly. Says he thinks the beer "isn't agreeing with me" anymore.  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 None since.  No other drug use.   SurgHx; none.    Review of Systems     Objective:   Physical Exam Well appearing, no apparent distress HEENT Neck supple. No jaundice. No cervical adenopathy. Moist mucus membranes.  COR regular S1S2 PULM Clear bilaterally, no rales.  ABD Soft, nontender. No organomegaly. Audible bowel sounds noted.  Negative Murphys sign.        Assessment & Plan:

## 2014-03-20 NOTE — Assessment & Plan Note (Signed)
Patient with elevated tranaminases with normal Alk Phos. Drinks beer 6-7 beers/week by his estimate. 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 To repeat CMet today, also to screen for viral hepatitis with hep panel. Follow up afterward. He reports he has not had any alcohol since we discussed the elevated transaminases, does not take otc analgesics either.

## 2014-03-23 ENCOUNTER — Telehealth: Payer: Self-pay | Admitting: Family Medicine

## 2014-03-23 NOTE — Telephone Encounter (Signed)
[phone] I called to report lab results, spoke with patient in Spanish. Discussed that liver enzymes remain mildly elevated, but the viral hepatitis panel is negative. He has refrained from beer. I mentioned we may consider RUQ US to evaluate for fatty deposits in liver; nothing to suggest cholelithiasis. Discussed A1C above 6.5%, meeting criteria for DM2. Discussed dietary changes, including sharp reduction in starches and increase in dietary fiber, and increase in physical activity. To follow up here in about 1 month and re-evaluate/discuss DM lifestyle modifications before embarking on metformin.  JB

## 2014-04-27 ENCOUNTER — Ambulatory Visit (INDEPENDENT_AMBULATORY_CARE_PROVIDER_SITE_OTHER): Payer: 59 | Admitting: Family Medicine

## 2014-04-27 ENCOUNTER — Encounter: Payer: Self-pay | Admitting: Family Medicine

## 2014-04-27 VITALS — BP 128/78 | HR 60 | Temp 98.3°F | Ht 64.0 in | Wt 194.3 lb

## 2014-04-27 DIAGNOSIS — E8881 Metabolic syndrome: Secondary | ICD-10-CM | POA: Diagnosis not present

## 2014-04-27 DIAGNOSIS — IMO0002 Reserved for concepts with insufficient information to code with codable children: Secondary | ICD-10-CM

## 2014-04-27 DIAGNOSIS — R740 Nonspecific elevation of levels of transaminase and lactic acid dehydrogenase [LDH]: Secondary | ICD-10-CM | POA: Diagnosis not present

## 2014-04-27 LAB — COMPREHENSIVE METABOLIC PANEL
ALK PHOS: 74 U/L (ref 39–117)
ALT: 76 U/L — AB (ref 0–53)
AST: 36 U/L (ref 0–37)
Albumin: 4.2 g/dL (ref 3.5–5.2)
BILIRUBIN TOTAL: 0.5 mg/dL (ref 0.2–1.2)
BUN: 13 mg/dL (ref 6–23)
CO2: 24 meq/L (ref 19–32)
CREATININE: 0.64 mg/dL (ref 0.50–1.35)
Calcium: 8.9 mg/dL (ref 8.4–10.5)
Chloride: 105 mEq/L (ref 96–112)
Glucose, Bld: 176 mg/dL — ABNORMAL HIGH (ref 70–99)
Potassium: 4.3 mEq/L (ref 3.5–5.3)
SODIUM: 138 meq/L (ref 135–145)
Total Protein: 6.7 g/dL (ref 6.0–8.3)

## 2014-04-27 NOTE — Assessment & Plan Note (Signed)
A: Transaminases elevated last visit about 1 month ago. Denies abdominal pain, N/V, or other frank symptoms. Beer intake is decreased greatly. DDx wide, possibly related to alcohol intake, metabolic syndrome, NASH, etc.  P: Repeat CMP today to see how liver enzymes are trending. Consider RUQ ultrasound or other imaging to evaluate liver depending on result.

## 2014-04-27 NOTE — Progress Notes (Signed)
   Subjective:    Patient ID: Allen Rose, male    DOB: 05/29/1975, 39 y.o.   MRN: [id number]  Visit conducted in Spanish (phone interpreter, [employer]).  HPI: Pt presents to clinic for f/u of increased transaminases and A1c meeting criteria for possible early diabetes. They also discussed reflux, and he has been taking omeprazole. He last saw Dr. Mauricio PoBreen about 1 month ago, and Dr. Mauricio PoBreen discussed diet / lifestyle changes. Since then, he has been eating fewer tortillas / rice, drinking much less beer. He is drinking some more water and denies frank polyuria / polydipsia. He has started to "feel some better." He has much less heartburn symptoms. He denies increased symptoms / nausea / vomiting / abdominal pain with meals or specific foods. He is going to the gym a couple of times per week. He is interested in seeing Dr. Gerilyn PilgrimSykes for nutrition therapy.  Review of Systems: As above.      Objective:   Physical Exam BP 128/78 mmHg  Pulse 60  Temp(Src) 98.3 F (36.8 C) (Oral)  Ht 5\' 4"  (1.626 m)  Wt 194 lb 4.8 oz (88.134 kg)  BMI 33.34 kg/m2 Gen: well-appearing adult male in NAD HEENT: [place]/AT, EOMI, PERRLA, MMM Cardio: RRR, no murmur appreciated Pulm: CTAB, no wheezes, normal WOB Abd: soft, nontender, BS+; truncal obesity noted Ext: warm, well-perfused, no LE edema     Assessment & Plan:  See problem list notes.

## 2014-04-27 NOTE — Patient Instructions (Signed)
Thank you for coming in, today!  I want to recheck some labs, today. I will call you or send you a letter with the results.  Stay with your diet and exercise changes. I will refer you to Dr. Sykes for diet counseling. HGerilyn Pilgrimer office is here in this building. I will give you her card to call to schedule an appointment with her. You have to call her to make an appointment.  Come back to see me in a couple of months. We will see how you are doing with diet and exercise changes.  Depending on how things look at that time, I might recommend starting some medicine. Please feel free to call with any questions or concerns at any time, at [phone]. --Dr. Casper HarrisonStreet

## 2014-04-27 NOTE — Assessment & Plan Note (Signed)
A: Meets metabolic syndrome criteria (BMI >33, triglycerides >200, glucose >100) and also with truncal obesity. Recent A1c 6.6 so possible early diabetes. Some concomitant GERD-type symptoms and elevated transaminases, but generally asymptomatic. Working on diet / exercise improvements.  P: Continue therapeutic diet / exercise changes. Briefly counseled on reduced carbohydrate intake, drinking more water, etc (i.e., very basic dietary management strategies). Rechecking CMP, today; see separate problem list note for elevated transaminases. Referred to Dr. Gerilyn PilgrimSykes for formal nutrition counseling, today. Plan to f/u in 2 months for repeat labs / A1c, at that time, or sooner as needed.

## 2014-04-28 ENCOUNTER — Telehealth: Payer: Self-pay | Admitting: Family Medicine

## 2014-04-28 ENCOUNTER — Encounter: Payer: Self-pay | Admitting: Family Medicine

## 2014-04-28 NOTE — Telephone Encounter (Signed)
Entered in error

## 2014-06-26 ENCOUNTER — Encounter: Payer: Self-pay | Admitting: Family Medicine

## 2014-06-26 ENCOUNTER — Ambulatory Visit (INDEPENDENT_AMBULATORY_CARE_PROVIDER_SITE_OTHER): Payer: 59 | Admitting: Family Medicine

## 2014-06-26 VITALS — BP 137/88 | HR 64 | Temp 98.9°F | Ht 64.0 in | Wt 195.7 lb

## 2014-06-26 DIAGNOSIS — R740 Nonspecific elevation of levels of transaminase and lactic acid dehydrogenase [LDH]: Secondary | ICD-10-CM | POA: Diagnosis not present

## 2014-06-26 DIAGNOSIS — E8881 Metabolic syndrome: Secondary | ICD-10-CM

## 2014-06-26 DIAGNOSIS — IMO0002 Reserved for concepts with insufficient information to code with codable children: Secondary | ICD-10-CM

## 2014-06-26 LAB — COMPREHENSIVE METABOLIC PANEL
ALK PHOS: 76 U/L (ref 39–117)
ALT: 70 U/L — ABNORMAL HIGH (ref 0–53)
AST: 33 U/L (ref 0–37)
Albumin: 4.2 g/dL (ref 3.5–5.2)
BUN: 12 mg/dL (ref 6–23)
CALCIUM: 9.4 mg/dL (ref 8.4–10.5)
CHLORIDE: 105 meq/L (ref 96–112)
CO2: 25 meq/L (ref 19–32)
CREATININE: 0.75 mg/dL (ref 0.50–1.35)
Glucose, Bld: 104 mg/dL — ABNORMAL HIGH (ref 70–99)
POTASSIUM: 4.1 meq/L (ref 3.5–5.3)
Sodium: 140 mEq/L (ref 135–145)
TOTAL PROTEIN: 6.5 g/dL (ref 6.0–8.3)
Total Bilirubin: 0.5 mg/dL (ref 0.2–1.2)

## 2014-06-26 LAB — POCT GLYCOSYLATED HEMOGLOBIN (HGB A1C): HEMOGLOBIN A1C: 6.3

## 2014-06-26 LAB — LDL CHOLESTEROL, DIRECT: Direct LDL: 100 mg/dL — ABNORMAL HIGH

## 2014-06-26 NOTE — Assessment & Plan Note (Signed)
A: Criteria met for metabolic syndrome (BMI >70, TG > 200, glucose > 100, plus truncal obesity). A1c improved slightly 6.3 with diet / lifestyle changes only. Concomitant GERD symptoms improved with omeprazole and about 6 lbs of weight loss in the past 5 months. Also with elevated transaminases on recent labs; see separate problem list note.  P: Praised diet changes and encouraged continued improvements. Strongly (re-)encouraged to f/u with Dr. Jenne Campus for nutrition therapy. Rechecked A1c today as above, and rechecking CMP given transaminase elevation. F/u in about 3 months with new PCP.

## 2014-06-26 NOTE — Assessment & Plan Note (Signed)
A: ALT remains mildly elevated at 70, but stable to improved from a few months ago and definitely improved from initial diagnosis. No frank symptoms and actually improved weight and A1c, otherwise. DDx broad, including relation to alcohol intake (doubtful given no AST elevation / non-alcoholic pattern), NASH, metabolic syndrome, etc.  P: Will discuss with pt and consider RUQ ultrasound or other liver imaging, but may continue to monitor with labs periodically. F/u in 3 months, regardless, at next appt for metabolic syndrome.

## 2014-06-26 NOTE — Patient Instructions (Signed)
Thank you for coming in, today!  Everything sounds okay, today. Call Dr. Gerilyn Pilgrim to set up an appointment with her. I will let you know by letter or phone call what your labs look like.  Come back to see [place] in about 3 months unless we tell you different. My last day is June 30th -- after that, you'll have a different doctor here in this same building.  Please feel free to call with any questions or concerns at any time, at [phone]. --Dr. Casper Harrison

## 2014-06-26 NOTE — Progress Notes (Signed)
   Subjective:    Patient ID: Allen Rose, male    DOB: [birth year], 39 y.o.   MRN: [id number]  Visit conducted in Spanish (in-person interpretor Windy Fast).  HPI: Pt presents to clinic for f/u of increased transaminases and previous A1c meeting criteria for possible early diabetes; pt does meet criteria for metabolic syndrome. Pt has made "big changes" to his diet in the past few months, considerably cutting back carbs in his diet. He reports he drinks "maybe one soda every 2 weeks." He also reports drinking much less beer. He denies frank polyuria / polydipsia, abdominal pain, N/V. He has had some problems with reflux in the past and feels omeprazole continues to help. He states he has not been going to the gym as much as he was but is trying to get back into it. He was referred to see Dr. Gerilyn Pilgrim at his last visit and is planning to call her but has not set up an appointment for nutrition therapy, yet.  Review of Systems: As above. Generally feels well.     Objective:   Physical Exam BP 137/88 mmHg  Pulse 64  Temp(Src) 98.9 F (37.2 C) (Oral)  Ht 5\' 4"  (1.626 m)  Wt 195 lb 11.2 oz (88.769 kg)  BMI 33.58 kg/m2 Gen: well-appearing adult male in NAD HEENT: [place]/AT, EOMI, PERRLA, MMM Cardio: RRR, no murmur appreciated Pulm: CTAB, no wheezes, normal WOB Abd: soft, nontender, BS+; truncal obesity noted Ext: warm, well-perfused, no LE edema     Assessment & Plan:  See problem list notes.

## 2014-06-29 ENCOUNTER — Telehealth: Payer: Self-pay | Admitting: Family Medicine

## 2014-06-29 NOTE — Telephone Encounter (Signed)
Spanish interpretor Graciela left message on voicemail with message from MD. Patient to call back with any questions.

## 2014-06-29 NOTE — Telephone Encounter (Signed)
Note pt requested Spanish-speaking messages.  Red Team / Rosa: Please call pt to relay the following -- His labs look fine. His A1c (the measure of his average blood sugar) and his LDL (his "bad" cholesterol) are both improved since the last time we checked. His liver numbers are slightly better as well, but one is still mildly elevated. I would recommend that he follows up with Dr. Gerilyn Pilgrim as we discussed, and I would advise him to continue the diet and exercise changes he's already made. They've made a good difference. He should follow up with his new doctor after me, as we discussed. If he needs anything in the meantime, he can come back to see me sooner than that (before June 30th) or his new PCP any time (after July 1st). I do not know who his regular doctor will be, yet. Thanks! --CMS

## 2014-08-20 ENCOUNTER — Encounter: Payer: Self-pay | Admitting: Family Medicine

## 2014-08-20 ENCOUNTER — Ambulatory Visit (INDEPENDENT_AMBULATORY_CARE_PROVIDER_SITE_OTHER): Payer: 59 | Admitting: Family Medicine

## 2014-08-20 VITALS — BP 127/84 | HR 68 | Temp 98.9°F | Wt 198.0 lb

## 2014-08-20 DIAGNOSIS — L509 Urticaria, unspecified: Secondary | ICD-10-CM

## 2014-08-20 MED ORDER — CETIRIZINE HCL 10 MG PO TABS: 10.0000 mg | ORAL_TABLET | Freq: Every day | ORAL | Status: DC

## 2014-08-20 MED ORDER — PREDNISONE 50 MG PO TABS: 50.0000 mg | ORAL_TABLET | Freq: Every day | ORAL | Status: DC

## 2014-08-20 NOTE — Patient Instructions (Signed)
Take prednisone  daily for 5 days Zyrtec  daily  If any fevers, body aches, nausea/vomiting go to ER  Return Monday if not better  Be well, Dr. Marc Morgans  (Hives)  Las ronchas son reas de la piel inflamadas (hinchadas) rojas y que pican. Pueden cambiar de tamao y de ubicacin en el cuerpo. Las Armed forces operational officer y Geneticist, molecular durante algunas horas o das (ronchas agudas) o durante algunas semanas (ronchas crnicas). No pueden transmitirse de [place] persona a Theodoro Clock (no son contagiosas). Pueden empeorar al rascarse, hacer ejercicios y por estrs emocional.  CAUSAS   Reaccin alrgica a alimentos, aditivos o frmacos.  Infecciones, incluso el resfro comn.  Enfermedades, como la vasculitis, el lupus o la enfermedad tiroidea.  Exposicin al sol, al calor o al fro.  La prctica de ejercicios.  El estrs.  El contacto con algunas sustancias qumicas. SNTOMAS   Zonas hinchadas, rojas o blancas, sobre la piel. Las ronchas pueden cambiar de Brooten, forma, [place] y Armed forces logistics/support/administrative officer.  Picazn.  Hinchazn de las [employer] y [employer]. Esto puede ocurrir si las ronchas se desarrollan en capas profundas de la piel. DIAGNSTICO  El mdico puede diagnosticar el problema haciendo un examen fsico. Conley Rolls indicar anlisis de sangre o un estudio de la piel para Production assistant, radio causa. En algunos casos, no puede determinarse la causa.  TRATAMIENTO  Los casos leves generalmente mejoran con medicamentos como los antihistamnicos. Los casos ms graves pueden requerir una inyeccin de epinefrina de Associate Professor. Si se conoce la causa de la urticaria, el tratamiento incluye evitar el factor desencadenante.  INSTRUCCIONES PARA EL CUIDADO EN EL HOGAR   Evite las causas que han desencadenado las ronchas.  Tome los antihistamnicos segn las indicaciones del mdico para reducir la gravedad de las ronchas. Generalmente se recomiendan los [employer] no son  sedantes o con bajo efecto sedante. No conduzca vehculos mientras toma antihistamnicos.  Tome los medicamentos para la picazn exactamente como le indic el mdico.  Use ropas sueltas.  Cumpla con todas las visitas de control, segn le indique su mdico. SOLICITE ATENCIN MDICA SI:   Siente una picazn intensa o persistente que no se calma con los medicamentos.  Le duelen las articulaciones o estn inflamadas. SOLICITE ATENCIN MDICA DE INMEDIATO SI:   Tiene fiebre.  Tiene la boca o los labios hinchados.  Tiene problemas para respirar o tragar.  Siente una opresin en la garganta o en el pecho.  Siente dolor abdominal. Estos problemas pueden ser los primeros signos de una reaccin alrgica que ponga en peligro la vida. Llame a los servicios de emergencia locales (911 en los [place]). ASEGRESE DE QUE:   Comprende estas instrucciones.  Controlar su enfermedad.  Solicitar ayuda de inmediato si no mejora o si empeora. Document Released: 01/02/2005 Document Revised: 01/07/2013 [facility] Patient Information 2015 [place], [place]. This information is not intended to replace advice given to you by your health care provider. Make sure you discuss any questions you have with your health care provider.

## 2014-08-21 NOTE — Progress Notes (Signed)
Patient ID: Allen Rose, male   DOB: [birth year], 39 y.o.   MRN: [id number]  Spanish interpreter utilized during today's visit.   HPI:  Pt presents for a same day appointment to discuss rash.  On Tuesday night pt began experiencing rash. It started on his legs. Very pruritic. Has since spread to his back and arms. Took a benadryl which helped the rash but didn't make it go away completely. He works in a [employer], [employer] and states the heat from cooking make sit worse. No hx of this in the past. On Tuesday he did feel mildly achy, but this resolved with tylenol. Denies fever, headache, sores in mouth/genitals, new medicines, tick bites, new detergents or fabric softeners. Also denies any swelling of his mouth, tongue, or lips. Denies any difficulty breathing. The rash comes and goes from one location to another.  Potential causes: Monday he had walked outside through brush. Tuesday around 5pm he ate some fish that had been sent from [place] He has eaten this same fish in the past without difficulty, and frequently eats fish in his regular diet.  ROS: See HPI  PMFSH: hx metabolic syndrome, thyroiditis, GERD, depression  PHYSICAL EXAM: BP 127/84 mmHg  Pulse 68  Temp(Src) 98.9 F (37.2 C) (Oral)  Wt 198 lb (89.812 kg) Gen: NAD, pleasant, cooperative HEENT: NCAT. MMM. No oral lesions seen Lungs: normal respiratory effort Neuro: speech norma, grossly nonfocal, normal gait Skin: scattered blanching erythematous macular rash on arms, legs, back. No skin breakdown or weeping. No vesicles or papules.  ASSESSMENT/PLAN:  1. Rash: appearance consistent with urticaria. Etiology unclear but potential etiologies include food allergy from fish, contact irritation from walking through brush, or a viral syndrome. Strongly doubt RMSF as no fever, headache, or tick exposures, also presence of pruritis makes RMSF much less likely. With this degree of pruritis, systemic steroids are warranted to control  symptoms. Plan: - prednisone  daily x 5 days - zyrtec  daily - return if not improved by Monday, sooner if worsening or if any red flags (see AVS) - note given to excuse pt from work as heat from kitchen worsens pruritis  FOLLOW UP: F/u as needed if symptoms worsen or do not improve.   [place] J. Pollie Meyer, MD [facility]

## 2014-09-28 ENCOUNTER — Ambulatory Visit: Payer: 59 | Admitting: Family Medicine

## 2014-09-29 ENCOUNTER — Ambulatory Visit (INDEPENDENT_AMBULATORY_CARE_PROVIDER_SITE_OTHER): Payer: 59 | Admitting: Family Medicine

## 2014-09-29 VITALS — BP 122/80 | HR 69 | Temp 99.8°F | Wt 194.5 lb

## 2014-09-29 DIAGNOSIS — E119 Type 2 diabetes mellitus without complications: Secondary | ICD-10-CM | POA: Diagnosis not present

## 2014-09-29 DIAGNOSIS — J069 Acute upper respiratory infection, unspecified: Secondary | ICD-10-CM | POA: Diagnosis not present

## 2014-09-29 DIAGNOSIS — Z23 Encounter for immunization: Secondary | ICD-10-CM | POA: Diagnosis not present

## 2014-09-29 LAB — POCT GLYCOSYLATED HEMOGLOBIN (HGB A1C): HEMOGLOBIN A1C: 6.6

## 2014-09-29 MED ORDER — METFORMIN HCL 500 MG PO TABS: 500.0000 mg | ORAL_TABLET | Freq: Two times a day (BID) | ORAL | Status: DC

## 2014-09-29 NOTE — Patient Instructions (Addendum)
Keep appt with eye doctor in November  For weight management: Call Dr. Gerilyn Pilgrim (our nutritionist) to set up an appointment. Her phone number is: [phone].   Checking urine for protein Start metformin  twice a day Follow up with Dr. Richarda Blade in 3 months to recheck your diabetes  Be well, Dr. Pollie Meyer   La diabetes mellitus y los alimentos (Diabetes Mellitus and Food) Es importante que controle su nivel de azcar en la sangre (glucosa). El nivel de glucosa en sangre depende en gran medida de lo que usted come. Comer alimentos saludables en las cantidades [place] a lo largo del Futures trader, aproximadamente a la misma hora [employer], lo ayudar a Chief Operating Officer su nivel de Event organiser. Tambin puede ayudarlo a retrasar o Fish farm manager de la diabetes mellitus. Comer de [employer] saludable incluso puede ayudarlo a Event organiser de presin arterial y a Barista o Pharmacologist un peso saludable.  CMO PUEDEN AFECTARME LOS ALIMENTOS? Carbohidratos Los carbohidratos afectan el nivel de glucosa en sangre ms que cualquier otro tipo de alimento. El nutricionista lo ayudar a Chief Strategy Officer cuntos carbohidratos puede consumir en cada comida y ensearle a contarlos. El recuento de carbohidratos es importante para mantener la glucosa en sangre en un nivel saludable, en especial si utiliza insulina o toma determinados medicamentos para la diabetes mellitus. Alcohol El alcohol puede provocar disminuciones sbitas de la glucosa en sangre (hipoglucemia), en especial si utiliza insulina o toma determinados medicamentos para la diabetes mellitus. La hipoglucemia es una afeccin que puede poner en peligro la vida. Los sntomas de la hipoglucemia (somnolencia, mareos y Administrator) son similares a los sntomas de haber consumido mucho alcohol.  Si el mdico lo autoriza a beber alcohol, hgalo con moderacin y siga estas pautas:  Las mujeres no deben beber ms de un trago por da, y los hombres no deben beber  ms de dos tragos por Futures trader. Un trago es igual a:  12 onzas (355 ml) de cerveza  5 onzas de vino (150 ml) de vino  1,5onzas (45ml) de bebidas espirituosas  No beba con el estmago vaco.  Mantngase hidratado. Beba agua, gaseosas dietticas o t helado sin azcar.  Las gaseosas comunes, los jugos y otros refrescos podran contener muchos carbohidratos y se Heritage manager. QU ALIMENTOS NO SE RECOMIENDAN? Cuando haga las elecciones de alimentos, es importante que recuerde que todos los alimentos son distintos. Algunos tienen menos nutrientes que otros por porcin, aunque podran tener la misma cantidad de caloras o carbohidratos. Es difcil darle al cuerpo lo que necesita cuando consume alimentos con menos nutrientes. Estos son algunos ejemplos de alimentos que debera evitar ya que contienen muchas caloras y carbohidratos, pero pocos nutrientes:  Neurosurgeon trans (la mayora de los alimentos procesados incluyen grasas trans en la etiqueta de Informacin nutricional).  Gaseosas comunes.  Jugos.  Caramelos.  Dulces, como tortas, pasteles, rosquillas y [place].  Comidas fritas. QU ALIMENTOS PUEDO COMER? Consuma alimentos ricos en nutrientes, que nutrirn el cuerpo y lo mantendrn saludable. Los alimentos que debe comer tambin dependern de varios factores, como:  Las caloras que necesita.  Los medicamentos que toma.  Su peso.  El nivel de glucosa en [place].  El [place] de presin arterial.  El nivel de colesterol. Tambin debe consumir una variedad de [place], como:  Protenas, como carne, aves, pescado, tofu, frutos secos y semillas (las protenas de [place] magros son mejores).  Nils Pyle.  Verduras.  Productos lcteos, como [place], queso y yogur (descremados son mejores).  Panes, granos,  pastas, cereales, arroz y frijoles.  Grasas, como aceite de [place], [place] sin grasas trans, aceite de canola, aguacate y [place]. TODOS LOS QUE PADECEN DIABETES MELLITUS  TIENEN EL MISMO PLAN DE COMIDAS? Dado que todas las personas que padecen diabetes mellitus son distintas, no hay un solo plan de comidas que funcione para todos. Es muy importante que se rena con un nutricionista que lo ayudar a crear un plan de comidas adecuado para usted. Document Released: 04/11/2007 Document Revised: 01/07/2013 [facility] Patient Information 2015 [place], [place]. This information is not intended to replace advice given to you by your health care provider. Make sure you discuss any questions you have with your health care provider.

## 2014-09-29 NOTE — Progress Notes (Signed)
Patient ID: Allen Rose, male   DOB: [birth year], 39 y.o.   MRN: [id number]  Spanish interpreter utilized during today's visit.   HPI:  Pt presents to f/u on pre-diabetes. Reports he was never diagnosed with actual diabetes in the past, despite A1c of 6.6 earlier this year. Has worked on diet and exercise. Only current medication is zyrtec, and acyclovir for his eyes (unclear indication for this). Denies chest pain or SOB.  Has also had throat pain for 2 days. No fevers. Normal PO intake. Multiple other family members have had recent URI. Mild cough.  ROS: See HPI.  PMFSH: hx acanthosis, metabolic syndrome, warts, obesity, depression, thyroiditis  PHYSICAL EXAM: BP 122/80 mmHg  Pulse 69  Temp(Src) 99.8 F (37.7 C) (Oral)  Wt 194 lb 8 oz (88.225 kg) Gen: NAD, pleasant, cooperative HEENT: NCAT. Oropharynx mildly erythematous, no significant exudates. No anterior cervical lymphadenopathy. Nares patent Heart: RRR no murmur Lungs: CTAB NWOB Neuro: grossly nonfocal, speech normal Ext: No appreciable lower extremity edema bilaterally   ASSESSMENT/PLAN:  Type 2 diabetes mellitus A1c today 6.6, diagnostic for diabetes. Reviewed results with patient and spent a long time with pt counseling on diagnosis. Plan: -start metformin  BID -refer to diabetic educator -given nutritionist info and handout on diabetic diet -urine microalbumin today -call eye doctor for appointment -immunizations today: tdap, flu, pneumovax -needs foot exam at future visit -recommend recheck lipids at next visit -f/u in 3 mos  URI - appears viral. Recommend supportive care. Return if worsening.  FOLLOW UP: F/u in 3 months for diabetes Refer to diabetic educator & nutritionist  [place] J. Pollie Meyer, MD [facility]

## 2014-09-30 LAB — MICROALBUMIN / CREATININE URINE RATIO
CREATININE, URINE: 181.5 mg/dL
MICROALB UR: 0.9 mg/dL (ref <2.0)
MICROALB/CREAT RATIO: 5 mg/g (ref 0.0–30.0)

## 2014-10-01 DIAGNOSIS — E119 Type 2 diabetes mellitus without complications: Secondary | ICD-10-CM | POA: Insufficient documentation

## 2014-10-01 NOTE — Assessment & Plan Note (Addendum)
A1c today 6.6, diagnostic for diabetes. Reviewed results with patient and spent a long time with pt counseling on diagnosis. Plan: -start metformin  BID -refer to diabetic educator -given nutritionist info and handout on diabetic diet -urine microalbumin today -call eye doctor for appointment -immunizations today: tdap, flu, pneumovax -needs foot exam at future visit -recommend recheck lipids at next visit -f/u in 3 mos

## 2014-10-02 ENCOUNTER — Encounter: Payer: Self-pay | Admitting: Family Medicine

## 2014-12-02 ENCOUNTER — Other Ambulatory Visit: Payer: Self-pay | Admitting: Family Medicine

## 2014-12-02 DIAGNOSIS — J309 Allergic rhinitis, unspecified: Secondary | ICD-10-CM

## 2014-12-29 ENCOUNTER — Encounter: Payer: Self-pay | Admitting: Family Medicine

## 2014-12-29 ENCOUNTER — Ambulatory Visit (INDEPENDENT_AMBULATORY_CARE_PROVIDER_SITE_OTHER): Payer: 59 | Admitting: Family Medicine

## 2014-12-29 VITALS — BP 118/69 | HR 54 | Temp 98.1°F | Ht 64.0 in | Wt 178.5 lb

## 2014-12-29 DIAGNOSIS — E119 Type 2 diabetes mellitus without complications: Secondary | ICD-10-CM | POA: Diagnosis not present

## 2014-12-29 LAB — POCT GLYCOSYLATED HEMOGLOBIN (HGB A1C): HEMOGLOBIN A1C: 5

## 2014-12-29 MED ORDER — METFORMIN HCL 500 MG PO TABS: 500.0000 mg | ORAL_TABLET | Freq: Every day | ORAL | Status: DC

## 2015-01-06 NOTE — Assessment & Plan Note (Signed)
A1c dropped to 5.0, working out daily and cut carbs dramatically, lost 16 pounds - gave positive reinforcement for significant lifestyle changes - decrease metformin to once daily, may stop altogether in future - continue diet and exercise (patient says he does not anticipate stopping as he feels much better now) - f/u in 6 months

## 2015-01-06 NOTE — Progress Notes (Signed)
   Subjective:   Allen Rose is a 39 y.o. male with a history of DM here for f/u DM  CHRONIC DIABETES  Disease Monitoring  Blood Sugar Ranges: doesn't check  Polyuria: no  Visual problems: no  Medication Compliance: sometimes only takes once a day Medication Side Effects  Hypoglycemia: no  Preventitive Health Care  Diet pattern: has cut sweets, limiting tortillas, drinking only water  Exercise: running and weight lifting daily    Review of Systems:  Per HPI. All other systems reviewed and are negative.   PMH, PSH, Medications, Allergies, and FmHx reviewed and updated in EMR.  Social History: never smoker  Objective:  BP 118/69 mmHg  Pulse 54  Temp(Src) 98.1 F (36.7 C) (Oral)  Ht 5\' 4"  (1.626 m)  Wt 178 lb 8 oz (80.967 kg)  BMI 30.62 kg/m2  Gen:  39 y.o. male in NAD HEENT: NCAT, MMM, EOMI, PERRL, anicteric sclerae CV: RRR, no MRG, no JVD Resp: Non-labored, CTAB, no wheezes noted Abd: Soft, NTND, BS present, no guarding or organomegaly Ext: WWP, no edema MSK: Full ROM, strength intact Neuro: Alert and oriented, speech normal      Chemistry      Component Value Date/Time   NA 140 06/26/2014 1122   K 4.1 06/26/2014 1122   CL 105 06/26/2014 1122   CO2 25 06/26/2014 1122   BUN 12 06/26/2014 1122   CREATININE 0.75 06/26/2014 1122   CREATININE 0.77 08/05/2007 2026      Component Value Date/Time   CALCIUM 9.4 06/26/2014 1122   ALKPHOS 76 06/26/2014 1122   AST 33 06/26/2014 1122   ALT 70* 06/26/2014 1122   BILITOT 0.5 06/26/2014 1122      Lab Results  Component Value Date   WBC 7.9 07/11/2013   HGB 14.2 07/11/2013   HCT 40.5 07/11/2013   MCV 89.6 07/11/2013   PLT 354 07/11/2013   Lab Results  Component Value Date   TSH 3.863 02/27/2014   Lab Results  Component Value Date   HGBA1C 5.0 12/29/2014   Assessment & Plan:     Allen Rose is a 39 y.o. male here for dm f/u  Type 2 diabetes mellitus A1c dropped to 5.0, working out  daily and cut carbs dramatically, lost 16 pounds - gave positive reinforcement for significant lifestyle changes - decrease metformin to once daily, may stop altogether in future - continue diet and exercise (patient says he does not anticipate stopping as he feels much better now) - f/u in 6 months    Beverely LowElena Shawnetta Lein, MD, MPH [facility] PGY-3 01/06/2015 8:38 PM

## 2015-03-29 ENCOUNTER — Ambulatory Visit (INDEPENDENT_AMBULATORY_CARE_PROVIDER_SITE_OTHER): Payer: BLUE CROSS/BLUE SHIELD | Admitting: Family Medicine

## 2015-03-29 VITALS — Temp 98.5°F | Wt 173.7 lb

## 2015-03-29 DIAGNOSIS — J020 Streptococcal pharyngitis: Secondary | ICD-10-CM | POA: Diagnosis not present

## 2015-03-29 DIAGNOSIS — J029 Acute pharyngitis, unspecified: Secondary | ICD-10-CM | POA: Diagnosis not present

## 2015-03-29 LAB — POCT RAPID STREP A (OFFICE): Rapid Strep A Screen: POSITIVE — AB

## 2015-03-29 MED ORDER — PENICILLIN V POTASSIUM 500 MG PO TABS: 500.0000 mg | ORAL_TABLET | Freq: Three times a day (TID) | ORAL | Status: DC

## 2015-03-29 NOTE — Patient Instructions (Addendum)
Thanks for coming in today.   You have a bacterial throat infection AKA Strep Throat.   We will treat you with an antibiotic.   Your symptoms should improve with treatment over the next several days.   If your symptoms do not get better, or if you develop worsening neck pain or swelling, then please return for further evaluation.   If you develop difficulty swallowing, speaking, or opening / closing your mouth, then please seek care immediately.   Thanks for letting us take care of you.   Sincerely, Devota Pacealeb Gianpaolo Mindel, MD Family Medicine - PGY 2  Faringitis estreptoccica (Strep Throat) La faringitis estreptoccica es una infeccin bacteriana que se produce en la garganta. El mdico puede llamarla amigdalitis o faringitis, en funcin de si hay inflamacin de las amgdalas o de la zona posterior de la garganta. La faringitis estreptoccica es ms frecuente durante los meses fros del ao en los nios de 5a 15aos, pero puede ocurrir durante cualquier estacin y en personas de todas las edades. La infeccin se transmite de [place] persona a otra (es contagiosa) a travs de la tos, el estornudo o el contacto directo. CAUSAS La faringitis estreptoccica es causada por la especie de bacterias Streptococcus pyogenes. FACTORES DE RIESGO Es ms probable que esta afeccin se manifieste en:  Las personas que pasan tiempo en lugares en los que hay mucha gente, donde la infeccin se puede diseminar fcilmente.  Las personas que tienen contacto cercano con alguien que padece faringitis estreptoccica. SNTOMAS Los sntomas de esta afeccin incluyen lo siguiente:  Grant RutsFiebre o escalofros.   Enrojecimiento, inflamacin o dolor de las amgdalas o la garganta.  Dolor o dificultad para tragar.  Manchas blancas o amarillas en las amgdalas o la garganta.  Ganglios hinchados o dolorosos con la palpacin en el cuello o debajo de la [place]ula.  Erupcin roja en todo el cuerpo (poco  frecuente). DIAGNSTICO Para diagnosticar esta afeccin, se realiza una prueba rpida para estreptococos o un hisopado de la garganta (cultivo de las secreciones de la garganta). Los resultados de la prueba rpida para estreptococos suelen Patent attorneyestar listos en pocos minutos, Berkshire Hathawaypero los del cultivo de las secreciones de la garganta tardan uno o [place] das. TRATAMIENTO Esta enfermedad se trata con antibiticos. INSTRUCCIONES PARA EL CUIDADO EN EL HOGAR Medicamentos  Baxter Internationalome los medicamentos de venta libre y los recetados solamente como se lo haya indicado el mdico.  Tome los antibiticos como se lo haya indicado el mdico. No deje de tomar los antibiticos aunque comience a sentirse mejor.  Haga que los miembros de la familia que tambin tienen dolor de garganta o fiebre se hagan pruebas de deteccin de la faringitis estreptoccica. Tal vez deban toma antibiticos si tienen la enfermedad. Comida y bebida  No comparta alimentos, tazas ni artculos personales que podran contagiar la infeccin a Economistotras personas.  Si tiene dificultad para tragar, intente consumir alimentos blandos hasta que el dolor de garganta mejore.  Beba suficiente lquido para Photographermantener la orina clara o de color amarillo plido. Instrucciones generales  Haga grgaras con una mezcla de agua y sal 3 o 4veces al da, o cuando sea necesario. Para preparar la mezcla de agua y sal, disuelva totalmente de media a 1cucharadita de sal en 1taza de agua tibia.  Asegrese de que todas las personas con las que convive se laven Longs Drug Storesbien las manos.  Descanse lo suficiente.  No concurra a la escuela o al Marisa Cypherstrabajo hasta que haya tomado los antibiticos durante 24horas.  Concurra a todas  las visitas de control como se lo haya indicado el mdico. Esto es importante. SOLICITE ATENCIN MDICA SI:  Los ganglios del cuello siguen agrandndose.  Aparece una erupcin cutnea, tos o dolor de odos.  Tose y expectora un lquido espeso de color verde o  amarillo amarronado, o con [place].  Tiene dolor o molestias que no mejoran con medicamentos.  Los Programmer, applications de Scientist, clinical (histocompatibility and immunogenetics).  Tiene fiebre. SOLICITE ATENCIN MDICA DE INMEDIATO SI:  Tiene sntomas nuevos, como vmitos, dolor de cabeza intenso, rigidez o dolor en el cuello, dolor en el pecho o falta de [place].  Le duele mucho la garganta, babea o tiene cambios en la visin.  Siente que el cuello se le hincha o que la piel de esa zona se vuelve roja y sensible.  Tiene signos de deshidratacin, como fatiga, boca seca y disminucin de la cantidad [place].  Comienza a sentir mucho sueo, o no logra despertarse por completo.  Las articulaciones estn enrojecidas o le duelen.   Esta informacin no tiene Theme park manager el consejo del mdico. Asegrese de hacerle al mdico cualquier pregunta que tenga.   Document Released: 10/12/2004 Document Revised: 09/23/2014 Elsevier Interactive Patient Education [employer]

## 2015-03-29 NOTE — Progress Notes (Signed)
Patient ID: Allen Rose, male   DOB: 10/02/1975, 40 y.o.   MRN: [id number]   [facility] Yolande Jollyaleb G Faust [place], MD Phone: [phone]  Subjective:   # Sore Throat - has had severe sore throat and neck pain for about 2 days. Now.  - No cough, no nasal discharge, drainage, or ear pain.  - No SOB - has had subjective fevers, and sweats. Has been taking ibuprofen every 6 hours along with tylenol with little improvement in the pain.  - No difficulty swallowing or closing / opening his mouth.  - He says that many people at work have been sick.  - he has also had body aches, and chills.  - He feels generally very poorly.  - He has had his flu shot this year.  - He does not smoke.   All relevant systems were reviewed and were negative unless otherwise noted in the HPI  Past Medical History Reviewed problem list.  Medications- reviewed and updated Current Outpatient Prescriptions  Medication Sig Dispense Refill  . metFORMIN (GLUCOPHAGE) 500 MG tablet Take 1 tablet (500 mg total) by mouth daily with breakfast. 90 tablet 3  . omeprazole (PRILOSEC) 20 MG capsule Take 1 capsule (20 mg total) by mouth daily. 30 capsule 3  . cetirizine (ZYRTEC) 10 MG tablet TAKE ONE TABLET BY MOUTH ONCE DAILY (Patient not taking: Reported on 03/29/2015) 30 tablet 12  . penicillin v potassium (VEETID) 500 MG tablet Take 1 tablet (500 mg total) by mouth 3 (three) times daily. For 10 days. 30 tablet 0   No current facility-administered medications for this visit.   Chief complaint-noted No additions to family history Social history- patient is a non smoker  Objective: Temp(Src) 98.5 F (36.9 C) (Oral)  Wt 173 lb 11.2 oz (78.79 kg) Gen: NAD, alert, cooperative with exam HEENT: NCAT, EOMI, PERRL, TM's normal. He has O/P with significant erythema, Exudates noted predominantly on the left tonsilar body.  Neck: FROM, supple, TTP diffusely, Unable to palpate any lymphadenopathy.  CV: RRR,  good S1/S2, no murmur, cap refill <3 Resp: CTABL, no wheezes, non-labored Abd: SNTND, BS present, no guarding or organomegaly Ext: No edema, warm, normal tone, moves UE/LE spontaneously Neuro: Alert and oriented, No gross deficits Skin: no rashes no lesions  Assessment/Plan:  # Streptococcal Pharyngitis - Rapid strep performed and positive, less likely to be Mono without LAD, and acute onset / severity of symptoms, though not improbable in a 40 y/o M.  - treat with Penicillin V oral x 10 days.  - If symptoms fail to improve, rash develops, consider testing for infectious Mono.  - Pt. Instructed in supportive care.  - Return precautions reviewed including s/s of abscess formation.  - F/U as needed with PCP.

## 2015-09-07 ENCOUNTER — Ambulatory Visit (INDEPENDENT_AMBULATORY_CARE_PROVIDER_SITE_OTHER): Payer: BLUE CROSS/BLUE SHIELD | Admitting: Family Medicine

## 2015-09-07 ENCOUNTER — Encounter: Payer: Self-pay | Admitting: Family Medicine

## 2015-09-07 VITALS — BP 135/79 | HR 66 | Temp 98.2°F | Ht 64.0 in | Wt 183.4 lb

## 2015-09-07 DIAGNOSIS — E119 Type 2 diabetes mellitus without complications: Secondary | ICD-10-CM | POA: Diagnosis not present

## 2015-09-07 LAB — POCT GLYCOSYLATED HEMOGLOBIN (HGB A1C): HEMOGLOBIN A1C: 5.3

## 2015-09-07 NOTE — Progress Notes (Signed)
Subjective:    Patient ID: Allen Rose , male   DOB: 03/23/1975 , 40 y.o..   MRN: [id number]  Allen Rose is a 40 yo M who presented to clinic for diabetes follow up. Diagnosed with T2DM 2 years ago. Prescribed Metformin about 14 months ago.  States he was initially taking Metformin twice daily as prescribed (1000mg ).  He has started eating healthier and exercising regularly.  Eats more salads, fruits, vegetables, lots of water and has cut out sodas, carbs and sugar in his coffee. He was told he may not need to take Metformin everyday and he was taking it inconsistently (one pill every 4 days, sometimes one pill every 15 days).   He has lost about 12 lbs and continues good diet and lifestyle modifications. He is here today with concerns whether he needs to continue taking his Metformin. He does not check his blood sugars at home. He denies symptoms of hypoglycemia.   Review of Systems: Per HPI. All other systems reviewed and are negative.  Health Maintenance Due  Topic Date Due  . FOOT EXAM  12/25/1986  . OPHTHALMOLOGY EXAM  12/25/1986  . HEMOGLOBIN A1C  06/29/2015  . INFLUENZA VACCINE  08/17/2015    Past Medical History: Patient Active Problem List   Diagnosis Date Noted  . Type 2 diabetes mellitus (HCC) 10/01/2014  . Metabolic syndrome 04/27/2014  . Nonspecific elevation of level of transaminase or lactic acid dehydrogenase (LDH) 03/04/2014  . Blurred vision, right eye 02/27/2014  . Thyroiditis, acute nonsuppurative 07/14/2013  . Dysuria 07/11/2013  . Acanthosis nigricans 07/11/2013  . Dental caries 02/01/2013  . Conjunctivitis unspecified 08/18/2012  . GERD 08/05/2007  . FATIGUE 08/05/2007  . Common wart 07/15/2007  . VENEREAL WART 07/15/2007  . EXOGENOUS OBESITY 07/15/2007  . DEPRESSIVE DISORDER NOT ELSEWHERE CLASSIFIED 07/15/2007    Medications: reviewed and updated Current Outpatient Prescriptions  Medication Sig Dispense Refill  . cetirizine (ZYRTEC)  10 MG tablet TAKE ONE TABLET BY MOUTH ONCE DAILY (Patient not taking: Reported on 03/29/2015) 30 tablet 12  . metFORMIN (GLUCOPHAGE) 500 MG tablet Take 1 tablet (500 mg total) by mouth daily with breakfast. 90 tablet 3  . omeprazole (PRILOSEC) 20 MG capsule Take 1 capsule (20 mg total) by mouth daily. 30 capsule 3  . penicillin v potassium (VEETID) 500 MG tablet Take 1 tablet (500 mg total) by mouth 3 (three) times daily. For 10 days. 30 tablet 0   No current facility-administered medications for this visit.    Social Hx:  reports that he has never smoked. He does not have any smokeless tobacco history on file.    Objective:   BP 135/79   Pulse 66   Temp 98.2 F (36.8 C) (Oral)   Ht 5\' 4"  (1.626 m)   Wt 183 lb 6.4 oz (83.2 kg)   BMI 31.48 kg/m  Physical Exam Gen: NAD, alert, cooperative with exam, well-appearing Cardiac: Regular rate and rhythm, normal S1/S2, no murmur, no edema Respiratory: Clear to auscultation bilaterally, no wheezes noted Gastrointestinal: soft, non tender, non distended, bowel sounds present Skin: no rashes, normal turgor  Neurological: no gross deficits.  Psych: good insight, normal mood and affect  Assessment & Plan:   40 yo M with PMH of GERD, T2DM, and obesity presenting to clinic for diabetes follow up today  -Hgb A1c in clinic today 5.3. Hgb 03/2014 was 6.6.  -patient falls into normal/pre-diabetic A1c range and does not need to continue Metformin -Encouraged to continue eating  well and exercising to maintain weight  -if he were to return with elevated Hbg A1c, restart metformin -- will then consider annual foot and eye exams for routine diabetic health maintenance.   Follow up: 6 months  Freddrick MarchYashika Josiel Gahm, MD 09/07/2015

## 2015-09-07 NOTE — Patient Instructions (Signed)
Thank you for letting us participate in your care! You were seen in clinic today for follow-up of your diabetes. Your Hbg A1c was checked and found to be 5.3. You do not need to continue taking your Metformin since your A1c is within normal range. You will need to continue eating healthy and exercising as you have been doing. Please follow up with your PCP in 6 months to re-check your Hgb A1c .    Diabetes mellitus tipo2, adultos (Type 2 Diabetes Mellitus, Adult) La diabetes mellitus tipo2, generalmente denominada diabetes tipo2, es una enfermedad prolongada (crnica). En la diabetes tipo2, el pncreas no produce suficiente insulina (una hormona), las clulas son menos sensibles a la insulina que se produce (resistencia a la insulina), o ambos. Normalmente, la Johnson Controlsinsulina mueve los azcares de los alimentos a las clulas de los tejidos. Las clulas de los tejidos Cendant Corporationutilizan los azcares para Psychiatristobtener energa. La falta de insulina o la falta de una respuesta normal a la insulina hace que el exceso de azcar se acumule en la sangre en lugar de Customer service managerpenetrar en las clulas de los tejidos. Como resultado, se producen niveles altos de Bankerazcar en la sangre (hiperglucemia). El [address]fect[address] de los niveles altos de azcar (glucosa) puede causar muchas complicaciones.  La diabetes tipo2 antes tambin se denominaba diabetes del [place] Plainadulto, pero puede ocurrir a Actuarycualquier edad.  FACTORES DE RIESGO  Una persona tiene mayor predisposicin a desarrollar diabetes tipo 2 si alguien en su familia tiene la enfermedad y tambin tiene uno o ms de los siguientes factores de riesgo principales:  Aumento de [place] Plainspeso, sobrepeso u obesidad.  Estilo de vida sedentario.  Una historia de consumo constante de alimentos de altas caloras. Mantener un peso saludable y realizar actividad fsica regular puede reducir la probabilidad de desarrollar diabetes tipo 2. SNTOMAS  Una persona con diabetes tipo 2 no presenta sntomas en un principio. Los  sntomas de la diabetes tipo 2 aparecen lentamente. Los sntomas son:  Aumento de la sed (polidipsia).  Aumento de la miccin (poliuria).  Orina con ms frecuencia durante la noche (nocturia).  Cambios repentinos en el peso o sin motivo aparente.  Infecciones frecuentes y recurrentes.  Cansancio (fatiga).  Debilidad.  Cambios en la visin, como visin borrosa.  Olor a Water quality scientistfruta en el aliento.  Dolor abdominal.  Nuseas o vmitos.  Cortes o moretones que tardan en sanarse.  Hormigueo o adormecimiento de las manos y los pies.  Una herida abierta en la piel (lcera). DIAGNSTICO Con frecuencia la diabetes tipo 2 no se diagnostica hasta que se presentan las complicaciones de la diabetes. La diabetes tipo 2 se diagnostica cuando los sntomas o las complicaciones se presentan y cuando aumentan los niveles de glucosa en la [place]angre. El nivel de glucosa en la sangre puede controlarse en uno o ms de los siguientes anlisis de sangre:  Medicin de glucosa en la sangre en [place]unas. No se le permitir comer durante al menos 8 horas antes de que se tome [place]una muestra de Five Pointssangre.  Pruebas al azar de glucosa en la sangre. El nivel de glucosa en la sangre se controla en cualquier momento del da sin importar el momento en que haya comido.  Prueba de A1c (hemoglobina glucosilada) Una prueba de A1c proporciona informacin sobre el control de la glucosa en la sangre durante los ltimos 3 meses.  Prueba de tolerancia a la glucosa oral (PTGO). La glucosa en la sangre se mide despus de no haber comido (ayunas) durante dos horas y despus de  beber una bebida que contenga glucosa. TRATAMIENTO   Usted puede necesitar administrarse insulina o medicamentos para la diabetes todos los das para [employer] niveles de glucosa en la sangre en el rango deseado.  Si [place] insulina, tal vez necesite ajustar la dosis segn los carbohidratos que haya consumido en cada comida o colacin.  [employer] del tratamiento,  se recomienda hacer cambios en el estilo de vida. Estos pueden incluir lo siguiente:  Customer service manager de alimentacin personalizado elaborado por un nutricionista.  Hacer ejercicio fsico a diario. El mdico establecer los objetivos personalizados del tratamiento para usted en funcin de su edad, los medicamentos que toma, el tiempo que hace que tiene diabetes y cualquier otra enfermedad que padezca. Generalmente, el objetivo del tratamiento es [employer] siguientes niveles sanguneos de glucosa:  Antes de las comidas (preprandial): de 80 a 130 mg/dl.  Antes de las comidas (preprandial): de 80 a 130 mg/dl.  A1c: menos del 6,5 % al 7 %. INSTRUCCIONES PARA EL CUIDADO EN EL HOGAR   Controle su nivel de hemoglobina A1c dos veces al ao.  Contrlese a diario Air cabin crew de glucosa en la sangre segn las indicaciones de su mdico.  Supervise las cetonas en la orina cuando est enferma y segn las indicaciones de su mdico.  Tome el medicamento para la diabetes o adminstrese insulina segn las indicaciones de su mdico para Radio producer nivel de glucosa en la sangre en el rango deseado.  Nunca se quede sin medicamento para la diabetes o sin insulina. Es necesario que la reciba [employer].  Si [place] insulina, tal vez deba ajustar la cantidad de insulina administrada segn los carbohidratos consumidos. Los hidratos de carbono pueden aumentar los niveles de glucosa en la sangre, pero deben incluirse en su dieta. Los hidratos de carbono aportan vitaminas, minerales y [place] que son [place] parte esencial de una dieta saludable. Los hidratos de carbono se encuentran en frutas, verduras, cereales integrales, productos lcteos, legumbres y alimentos que contienen azcares aadidos.  Consuma alimentos saludables. Programe una cita con un nutricionista certificado para que lo ayude a Chief Executive Officer de alimentacin adecuado para usted.  Baje de peso si es necesario.  Lleve una tarjeta de alerta mdica o  use una pulsera o medalla de alerta mdica.  Lleve con usted una colacin de 15gramos de hidratos de carbono en todo momento para controlar los niveles bajos de glucosa en la sangre (hipoglucemia). Algunos ejemplos de colaciones de 15gramos de hidratos de carbono son los siguientes:  Tabletas de glucosa, 3 o 4.  Gel de glucosa, tubo de 15 gramos.  Pasas de uva, 2 cucharadas (24 gramos).  Caramelos de goma, 6.  Galletas de [place], 8.  Gaseosa comn, 4onzas ( ).  Pastillas de goma, 9.  Reconocer la hipoglucemia. La hipoglucemia se produce cuando los niveles de glucosa en la sangre son de 70 mg/dl o menos. El riesgo de hipoglucemia aumenta durante el ayuno o cuando se saltea las comidas, durante o despus de Education officer, environmental ejercicio intenso y [place] duerme. Los sntomas de hipoglucemia son:  Temblores o sacudidas.  Disminucin de la capacidad de concentracin.  Sudoracin.  Aumento de la frecuencia cardaca.  Dolor de [place].  Sequedad en la boca.  Hambre.  Irritabilidad.  Ansiedad.  Sueo agitado.  Alteracin del habla o de la coordinacin.  Confusin.  Tratar la hipoglucemia rpidamente. Si usted est alerta y puede tragar con seguridad, siga la regla de 15/15 que consiste en:  Vanessa Barbara 15 y  20gramos de glucosa de accin rpida o carbohidratos. Las opciones de accin rpida son un gel de glucosa, tabletas de glucosa, o 4 onzas (120 ml) de jugo de frutas, gaseosa comn, o leche baja en grasa.  Compruebe su nivel de glucosa en la sangre 15 minutos despus de tomar la glucosa.  Tome entre 15 y 20 gramos ms de glucosa si el nivel de glucosa en la sangre todava es de 70mg /dl o inferior.  Ingiera una comida o una colacin en el lapso de 1 hora una vez que los niveles de glucosa en la sangre vuelven a la normalidad.  Est atento a si siente mucha sed u orina con mayor frecuencia, porque son signos tempranos de hiperglucemia. El reconocimiento temprano  de la hiperglucemia permite un tratamiento oportuno. Trate la hiperglucemia segn le indic su mdico.  Haga, al menos, de actividad fsica moderada a la semana, distribuidos en, por lo menos, 3das a la semana o como lo indique su mdico. Adems, debe realizar ejercicios de resistencia por lo menos 2veces a la semana o como lo indique su mdico. Trate de no permanecer inactivo durante ms de seguidos.  Ajuste su medicamento y la ingesta de alimentos, segn sea necesario, si inicia un nuevo ejercicio o deporte.  Siga su plan para los [address] de enfermedad cuando no puede comer o beber como de [place].  No consuma ningn producto que contenga tabaco, como cigarrillos, tabaco de Theatre manager o Administrator, Civil Service. Si necesita ayuda para dejar de fumar, hable con el mdico.  Limite el consumo de alcohol a no ms de 1 medida por da en las mujeres no embarazadas y 2 medidas en los hombres. Debe beber alcohol solo mientras come. Hable con su mdico acerca de si el alcohol es seguro para usted. Informe a su mdico si bebe alcohol varias veces a la semana.  Concurra a todas las visitas de control como se lo haya indicado el mdico. Esto es importante.  Programe un examen de la vista poco despus del diagnstico de diabetes tipo 2 y luego anualmente.  Realice diariamente el cuidado de la piel y de los pies. Examine su piel y los pies diariamente para ver si tiene cortes, moretones, enrojecimiento, problemas en las uas, sangrado, ampollas o Advertising account planner. Su mdico debe hacerle un examen de los pies una vez por ao.  Cepllese los dientes y encas por lo menos dos veces al da y use hilo dental al menos una vez por da. Concurra regularmente a las visitas de control con el dentista.  Comparta su plan de control de diabetes en el trabajo o en la escuela.  Mantngase al da con las vacunas. Se recomienda que se vacune contra la gripe todos los [place]. Adems, que se vacune contra la  neumona (vacuna antineumoccica). Si es mayor de 23 aos y nunca se Animator la neumona, esta vacuna puede administrarse como una serie de dos vacunas por separado. Pregntele al mdico qu otras vacunas se pueden recomendar.  Aprenda a Dealer.  Obtenga la mayor cantidad posible de informacin sobre la diabetes y solicite ayuda siempre que sea necesario.  Busque programas de rehabilitacin y participe en ellos para mantener o mejorar su independencia y su calidad de vida. Solicite la derivacin a fisioterapia o terapia ocupacional si se le [facility] o la mano, o tiene problemas para asearse, vestirse, comer, o durante la [place] fsica. SOLICITE ATENCIN MDICA SI:   No puede comer alimentos o beber por ms de 6  horas.  Tuvo nuseas o ha vomitado durante ms de 6 horas.  Su nivel de glucosa en la sangre es mayor de 240 mg/dl.  Presenta algn cambio en el estado mental.  Desarrolla una enfermedad grave adicional.  Tuvo diarrea durante ms de 6 horas.  Ha estado enfermo o ha tenido fiebre durante un par [address] y no mejora.  Siente dolor al practicar cualquier actividad fsica. SOLICITE ATENCIN MDICA DE INMEDIATO SI:  Tiene dificultad para respirar.  Tiene niveles de cetonas moderados a altos.   Esta informacin no tiene Theme park managercomo fin reemplazar el consejo del mdico. Asegrese de hacerle al mdico cualquier pregunta que tenga.   Document Released: 01/02/2005 Document Revised: 09/23/2014 Elsevier Interactive Patient Education [employer]2016 Elsevier Inc.

## 2016-01-25 ENCOUNTER — Ambulatory Visit (INDEPENDENT_AMBULATORY_CARE_PROVIDER_SITE_OTHER): Payer: BLUE CROSS/BLUE SHIELD | Admitting: Family Medicine

## 2016-01-25 ENCOUNTER — Encounter: Payer: Self-pay | Admitting: Family Medicine

## 2016-01-25 VITALS — BP 134/78 | HR 72 | Temp 97.4°F | Ht 64.0 in | Wt 185.2 lb

## 2016-01-25 DIAGNOSIS — M62838 Other muscle spasm: Secondary | ICD-10-CM | POA: Diagnosis not present

## 2016-01-25 DIAGNOSIS — Z23 Encounter for immunization: Secondary | ICD-10-CM | POA: Diagnosis not present

## 2016-01-25 MED ORDER — CYCLOBENZAPRINE HCL 10 MG PO TABS: 10.0000 mg | ORAL_TABLET | Freq: Three times a day (TID) | ORAL | 0 refills | Status: DC | PRN

## 2016-01-25 NOTE — Progress Notes (Signed)
    Subjective: EA:[id number]:back pain  [place] Spanish interpreter utilized for this encounter HPI: Patient is a 41 y.o. male with a past medical history of T2DM,  presenting to clinic today for a SDA for back pain.  He's had constant back pain x 2 week. It is a severe, throbbing and sometimes pulling pain in the thoracic region mostly on the left side. 8/10 pain.  No pain, numbness, weakness, or tingling in the arms.  He notes the pain is constant, therefore he is unsure if anything makes it worse.   He's been going to the gym, lifting weights, and stretching his arms but nothing seems to help. He typically goes to the gym 1-2x/week over the last 2 years so he does not think he's overexerted himself recently. Lifting does not make the pain worse, but he notes once his body gets cold after exercise it hurts more.  He has tried Tylenol and massage which intermittently helps.    Doesn't recall injury. Never had pain like this before.   Social History: no smoke exposure  Flu Vaccine: will get flu vaccine today   ROS: All other systems reviewed and are negative.  Past Medical History Patient Active Problem List   Diagnosis Date Noted  . Trapezius muscle spasm 01/26/2016  . Type 2 diabetes mellitus (HCC) 10/01/2014  . Metabolic syndrome 04/27/2014  . Nonspecific elevation of level of transaminase or lactic acid dehydrogenase (LDH) 03/04/2014  . Blurred vision, right eye 02/27/2014  . Thyroiditis, acute nonsuppurative 07/14/2013  . Dysuria 07/11/2013  . Acanthosis nigricans 07/11/2013  . Dental caries 02/01/2013  . Conjunctivitis unspecified 08/18/2012  . GERD 08/05/2007  . FATIGUE 08/05/2007  . Common wart 07/15/2007  . VENEREAL WART 07/15/2007  . EXOGENOUS OBESITY 07/15/2007  . DEPRESSIVE DISORDER NOT ELSEWHERE CLASSIFIED 07/15/2007    Medications- reviewed and updated  Objective: Office vital signs reviewed. BP 134/78   Pulse 72   Temp 97.4 F (36.3 C) (Oral)   Ht 5\' 4"  (1.626  m)   Wt 185 lb 3.2 oz (84 kg)   BMI 31.79 kg/m    Physical Examination:  General: Awake, alert, well- nourished, NAD Cardio: RRR, no m/r/g noted.  Pulm: No increased WOB.  CTAB, without wheezes, rhonchi or crackles noted.  MSK: Normal gait and station. Back normal to inspection. No tenderness over the spinous processes. Tenderness over the left trapezius with some spasticity.  Mild discomfort with shoulder abduction and extension, none with internal rotation on the left. 5/5 strength in the UEs bilaterally, sensation grossly intact. Normal radial pulses.   Assessment/Plan: Trapezius muscle spasm Exam and history consistent with muscle spasm of the trapezius muscle. No red flags on exam or history. Discussed gentle stretching and heat. Rx for Flexeril PRN- discussed this could make him drowsy. Return precautions. Pt to f/u with PCP in 2 weeks if no improvement in symptoms.    Orders Placed This Encounter  Procedures  . Flu Vaccine QUAD 36+ mos IM    Meds ordered this encounter  Medications  . cyclobenzaprine (FLEXERIL) 10 MG tablet    Sig: Take 1 tablet (10 mg total) by mouth 3 (three) times daily as needed for muscle spasms.    Dispense:  30 tablet    Refill:  0    Joanna Puffrystal S. Dorsey PGY-3, [facility]

## 2016-01-25 NOTE — Patient Instructions (Addendum)
Use heat to help with the pain, especially after exercising and stretching. I have prescribed Flexeril which is a muscle relaxer. The first time you take it, take it at nighttime as it can make you sleep. Follow up in 2 weeks if your symptoms are not improving. If you have weakness, numbness, or tingling in the arms follow up with us sooner.   Use calor para Engineer, materialsaliviar el dolor, especialmente despus de hacer ejercicio y Furniture conservator/restorerestirar. Le he recetado Flexeril, que es un relajante muscular. La primera vez que lo tomas, tmalo por la noche ya que puede hacerte dormir. Haga un seguimiento en 2 semanas si sus sntomas no mejoran. Si tiene debilidad, entumecimiento u hormigueo en los brazos, comunquese con nosotros antes.

## 2016-01-26 DIAGNOSIS — M62838 Other muscle spasm: Secondary | ICD-10-CM | POA: Insufficient documentation

## 2016-01-26 NOTE — Assessment & Plan Note (Addendum)
Exam and history consistent with muscle spasm of the trapezius muscle. No red flags on exam or history. Discussed gentle stretching and heat. Rx for Flexeril PRN- discussed this could make him drowsy. Return precautions. Pt to f/u with PCP in 2 weeks if no improvement in symptoms.

## 2016-04-18 ENCOUNTER — Encounter: Payer: Self-pay | Admitting: Family Medicine

## 2016-04-18 ENCOUNTER — Ambulatory Visit (INDEPENDENT_AMBULATORY_CARE_PROVIDER_SITE_OTHER): Payer: BLUE CROSS/BLUE SHIELD | Admitting: Family Medicine

## 2016-04-18 VITALS — BP 130/86 | HR 67 | Temp 98.2°F | Ht 64.0 in | Wt 185.8 lb

## 2016-04-18 DIAGNOSIS — M62838 Other muscle spasm: Secondary | ICD-10-CM | POA: Diagnosis not present

## 2016-04-18 DIAGNOSIS — E119 Type 2 diabetes mellitus without complications: Secondary | ICD-10-CM | POA: Diagnosis not present

## 2016-04-18 LAB — POCT GLYCOSYLATED HEMOGLOBIN (HGB A1C): HEMOGLOBIN A1C: 5.5

## 2016-04-18 NOTE — Patient Instructions (Signed)
It was nice seeing you again today in clinic! Your A1c was 5.5% and you do not need to continue taking your Metformin.  I encourage you to keep up the good work with your diet and exercise.    For your upper back pain, I recommended IcyHot and heating pads.  You should stretch before going to the gym.  You can pick up the IcyHot from [facility] or any pharmacy and apply it to your upper back as needed .    Be well,  Freddrick March, MD

## 2016-04-18 NOTE — Progress Notes (Signed)
   Subjective:   Patient ID: Allen Rose    DOB: [birth year], 41 y.o. male   MRN: [id number]  CC: diabetes follow up   HPI: Allen Rose is a 41 y.o. male who presents to clinic today for diabetes follow up.   Was previously taking Metformin but stopped about 6 months ago since his Hgb A1c was well controlled - 5.3.  He is here today for follow up and to check A1c.  Does not check sugars at home anymore.  Per patient, he exercised a lot and lost a significant amount of weight.  He exercises almost daily by walking and going to the gym.  Has recently gained a little bit of weight back because of the weather hasn't been going to the gym much.  Now that the weather is nicer, will resume outdoor exercise and gym routine.  Drinks only water now, no juices or sodas.  Eats a lot of spinach, broccoli, green apples, blueberries, raspberries, chicken, cabbage.  Does not eat packaged foods.    Upper back pain Additionally, complaining of upper back muscles feeling tight L>R. Does not bother him when he exercises.  Lifts 40-50 lbs.  Whenever he lays down it bothers him.  Thinks it may be related to exercise.  Has not taken anything at home.  Has not tried heating pads or icy hot.   Was seen in January for similar and prescribed Flexeril at that time. Reports that it helped but wants to know if there is anything topical he can try.   ROS: See HPI for pertinent ROS. Denies fevers, chills, nausea, vomiting, diarrhea.    Smoking status reviewed. Patient is a never smoker.  Medications reviewed.  Objective:   BP 130/86   Pulse 67   Temp 98.2 F (36.8 C) (Oral)   Ht  (1.626 m)   Wt 185 lb 12.8 oz (84.3 kg)   SpO2 98%   BMI 31.89 kg/m  Vitals and nursing note reviewed.  Gen: NAD, alert, cooperative with exam, well-appearing Cardiac: Regular rate and rhythm, normal S1/S2, no murmur, no edema Respiratory: Clear to auscultation bilaterally, no wheezes noted Gastrointestinal: soft, non  tender, non distended, bowel sounds present MSK: no tenderness over trapezius muscles, no erythema or swelling present Skin: no rashes, normal turgor  Neurological: no gross deficits.  Psych: good insight, normal mood and affect  Assessment & Plan:   Type 2 diabetes mellitus Stable.  Patient advised to not take Metformin in Aug 2017 as his A1c well controlled (5.3).   -Patient has continued to improve diet and exercise -Rechecked A1c today>>5.5% -Continue to monitor off Metformin, recheck A1c in 3 months   Trapezius muscle spasm  No erythema or tenderness of the overlying area.  Has tried Flexeril in the past (January) which helped.  Wanting to try a topical if possible.  Had not tried heating pads or Icy Hot yet.  -Likely related to exercise/weight lifting.  -Recommend stretching,  Icy Hot, and heating pads applied to the area -Will continue to monitor   Orders Placed This Encounter  Procedures  . CBC  . Basic Metabolic Panel  . HgB A1c   Follow up:  3 mo  Freddrick March, MD [facility], PGY-1 04/18/2016 10:01 PM

## 2016-04-18 NOTE — Assessment & Plan Note (Signed)
No erythema or tenderness of the overlying area.  Has tried Flexeril in the past (January) which helped.  Wanting to try a topical if possible.  Had not tried heating pads or Icy Hot yet.  -Likely related to exercise/weight lifting.  -Recommend stretching,  Icy Hot, and heating pads applied to the area -Will continue to monitor

## 2016-04-18 NOTE — Assessment & Plan Note (Signed)
Stable.  Patient advised to not take Metformin in Aug 2017 as his A1c well controlled (5.3).   -Patient has continued to improve diet and exercise -Rechecked A1c today>>5.5% -Continue to monitor off Metformin, recheck A1c in 3 months

## 2016-04-19 LAB — CBC
HEMATOCRIT: 42.1 % (ref 37.5–51.0)
Hemoglobin: 14 g/dL (ref 13.0–17.7)
MCH: 31.7 pg (ref 26.6–33.0)
MCHC: 33.3 g/dL (ref 31.5–35.7)
MCV: 96 fL (ref 79–97)
Platelets: 273 10*3/uL (ref 150–379)
RBC: 4.41 x10E6/uL (ref 4.14–5.80)
RDW: 13.9 % (ref 12.3–15.4)
WBC: 6.1 10*3/uL (ref 3.4–10.8)

## 2016-04-19 LAB — BASIC METABOLIC PANEL
BUN / CREAT RATIO: 18 (ref 9–20)
BUN: 14 mg/dL (ref 6–20)
CO2: 22 mmol/L (ref 18–29)
CREATININE: 0.77 mg/dL (ref 0.76–1.27)
Calcium: 9.2 mg/dL (ref 8.7–10.2)
Chloride: 102 mmol/L (ref 96–106)
GFR, EST AFRICAN AMERICAN: 132 mL/min/{1.73_m2} (ref >59)
GFR, EST NON AFRICAN AMERICAN: 114 mL/min/{1.73_m2} (ref >59)
GLUCOSE: 99 mg/dL (ref 65–99)
Potassium: 4.6 mmol/L (ref 3.5–5.2)
SODIUM: 139 mmol/L (ref 134–144)

## 2016-04-20 ENCOUNTER — Telehealth: Payer: Self-pay | Admitting: Family Medicine

## 2016-04-20 NOTE — Telephone Encounter (Signed)
Called patient to discuss lab results with him from bloodwork on 04/19/2016.   Discussed with him that he does not need to continue taking Metformin as his A1c is 5.5%.  All other labs within normal and patient appreciative of the call.

## 2016-09-29 ENCOUNTER — Ambulatory Visit: Payer: BLUE CROSS/BLUE SHIELD | Admitting: Family Medicine

## 2016-10-11 ENCOUNTER — Ambulatory Visit (INDEPENDENT_AMBULATORY_CARE_PROVIDER_SITE_OTHER): Payer: BLUE CROSS/BLUE SHIELD | Admitting: Family Medicine

## 2016-10-11 VITALS — BP 125/80 | HR 63 | Temp 98.4°F | Wt 188.4 lb

## 2016-10-11 DIAGNOSIS — E119 Type 2 diabetes mellitus without complications: Secondary | ICD-10-CM

## 2016-10-11 DIAGNOSIS — Z23 Encounter for immunization: Secondary | ICD-10-CM | POA: Diagnosis not present

## 2016-10-11 LAB — POCT GLYCOSYLATED HEMOGLOBIN (HGB A1C): HEMOGLOBIN A1C: 5.6

## 2016-10-11 NOTE — Progress Notes (Signed)
   Subjective:   Patient ID: Allen Rose    DOB: February 14, 1975, 41 y.o. male   MRN: [id number]  CC: diabetes f/u   HPI: Allen Rose is a 41 y.o. male who presents to clinic today for diabetic follow-up.  T2DM, controlled  -A1c today 5.6, previously was 6.6 in 2009  -Diet : drinks coffee with little bit of sugar, sometimes eats breads, rice 1-2x a week, eats mostly spinach, salads, vegetables and fruits  -lots of water daily ~10 bottles  -Exercise: continues to walk and go to the gym, weight lifting at gym, cardio and running -has not been checking sugars  -Medications: currently off Metformin; previously was on 500 mg daily    ROS: See HPI for pertinent ROS. PMFSH: Pertinent past medical, surgical, family, and social history were reviewed and updated as appropriate. Smoking status reviewed. Medications reviewed.  Objective:   BP 125/80 (BP Location: Right Arm, Patient Position: Sitting, Cuff Size: Normal)   Pulse 63   Temp 98.4 F (36.9 C) (Oral)   Wt 188 lb 6.4 oz (85.5 kg)   SpO2 96%   BMI 32.34 kg/m  Vitals and nursing note reviewed.  General: 41 yo male, NAD  CV: RRR no MRG  Lungs: CTAB, non-labored  Skin: warm, dry Extremities: warm and well perfused  Assessment & Plan:   Type 2 diabetes mellitus A1c 5.6 today, well controlled off Metformin -Encouraged to continue good lifestyle modifications  Recommend space checking A1c to 6 months   Health Maintenance:  -Flu shot administered at today's visit   Orders Placed This Encounter  Procedures  . Flu Vaccine QUAD 36+ mos IM  . HgB A1c   Follow up: 6 mo  Freddrick March, MD [facility], PGY-2 10/13/2016 3:53 PM

## 2016-10-11 NOTE — Patient Instructions (Signed)
Your A1c is 5.6 and you are doing great.  Please come back in 6 months recheck bloodwork at that time.   Call clinic with questions.   Be well,  Freddrick March, MD

## 2016-10-13 ENCOUNTER — Encounter: Payer: Self-pay | Admitting: Family Medicine

## 2016-10-13 NOTE — Assessment & Plan Note (Signed)
A1c 5.6 today, well controlled off Metformin -Encouraged to continue good lifestyle modifications  Recommend space checking A1c to 6 months

## 2017-02-14 ENCOUNTER — Other Ambulatory Visit: Payer: Self-pay

## 2017-02-14 ENCOUNTER — Encounter: Payer: Self-pay | Admitting: Student in an Organized Health Care Education/Training Program

## 2017-02-14 ENCOUNTER — Ambulatory Visit: Payer: BLUE CROSS/BLUE SHIELD | Admitting: Student in an Organized Health Care Education/Training Program

## 2017-02-14 VITALS — BP 122/72 | HR 57 | Temp 97.6°F | Ht 64.0 in | Wt 190.4 lb

## 2017-02-14 DIAGNOSIS — B9789 Other viral agents as the cause of diseases classified elsewhere: Secondary | ICD-10-CM

## 2017-02-14 DIAGNOSIS — J029 Acute pharyngitis, unspecified: Secondary | ICD-10-CM | POA: Diagnosis not present

## 2017-02-14 DIAGNOSIS — J069 Acute upper respiratory infection, unspecified: Secondary | ICD-10-CM

## 2017-02-14 LAB — POCT RAPID STREP A (OFFICE): Rapid Strep A Screen: NEGATIVE

## 2017-02-14 MED ORDER — GUAIFENESIN ER 600 MG PO TB12: 600.0000 mg | ORAL_TABLET | Freq: Two times a day (BID) | ORAL | 0 refills | Status: DC | PRN

## 2017-02-14 NOTE — Progress Notes (Signed)
   CC: sore throat  HPI: Allen Rose is a 42 y.o. male  Presenting for SDA for sore throat of 20 days duration.   Spanish interpreter used on ipad.  Sore throat with cough x 20 days.  Endorses +rhinorrhea, + congestion, +subjective fevers. Has teas at night to help with sore throat, also has been using tylenol and ibuprofen. No N/V/D.  Symptoms have been constant over the past 20 days.  He has been eating and drinking normally. No known sick contacts.  Review of Symptoms:  See HPI for ROS.   CC, SH/smoking status, and VS noted.  Objective: BP 122/72   Pulse (!) 57   Temp 97.6 F (36.4 C) (Oral)   Ht 5\' 4"  (1.626 m)   Wt 190 lb 6.4 oz (86.4 kg)   SpO2 99%   BMI 32.68 kg/m  GEN: NAD, alert, cooperative, and pleasant. EYE: no conjunctival injection, pupils equally round and reactive to light ENMT: normal tympanic light reflex, no nasal polyps,no rhinorrhea, + mild pharyngeal erythema without exudate NECK: full ROM, no thyromegaly, no cervical LAD RESPIRATORY: clear to auscultation bilaterally with no wheezes, rhonchi or rales, good effort CV: RRR, no m/r/g, no peripheral edema GI: soft, non-tender, non-distended, no hepatosplenomegaly SKIN: warm and dry, no rashes or lesions NEURO: II-XII grossly intact PSYCH: AAOx3, appropriate affect  Assessment and plan:  Viral URI with cough Persistent for past 20 days. Rapid strep negative. Most consistent with a viral URI with persistent cough. Recommend humidified air, staying hydrated, OTC remedies such as theraflu, guaifenesin, and cough drops. Work note provided. Return precautions discussed.   Orders Placed This Encounter  Procedures  . Rapid Strep A    Meds ordered this encounter  Medications  . guaiFENesin (MUCINEX) 600 MG 12 hr tablet    Sig: Take 1 tablet (600 mg total) by mouth 2 (two) times daily as needed for cough or to loosen phlegm.    Dispense:  30 tablet    Refill:  0    Howard PouchLauren Destine Zirkle, MD,MS,   PGY2 02/14/2017 10:28 AM

## 2017-02-14 NOTE — Assessment & Plan Note (Signed)
Persistent for past 20 days. Rapid strep negative. Most consistent with a viral URI with persistent cough. Recommend humidified air, staying hydrated, OTC remedies such as theraflu, guaifenesin, and cough drops. Work note provided. Return precautions discussed.

## 2017-02-14 NOTE — Patient Instructions (Addendum)
It was a pleasure seeing you today in our clinic. Today we discussed your sore throat. Here is the treatment plan we have discussed and agreed upon together:  I sent guaifenesin (Mucinex) to your pharmacy. It is ok to get this over the counter instead.  Please use over the counter remedies such as theraflu to help with your symptoms.  Please continue to stay hydrated at home.   Our clinic's number is [phone]. Please call with questions or concerns about what we discussed today.  Be well, Dr. Mosetta PuttFeng

## 2017-03-01 ENCOUNTER — Other Ambulatory Visit: Payer: Self-pay

## 2017-03-01 ENCOUNTER — Encounter: Payer: Self-pay | Admitting: Student

## 2017-03-01 ENCOUNTER — Ambulatory Visit: Payer: BLUE CROSS/BLUE SHIELD | Admitting: Student

## 2017-03-01 VITALS — BP 130/80 | HR 74 | Temp 98.6°F | Ht 64.0 in | Wt 189.0 lb

## 2017-03-01 DIAGNOSIS — B9789 Other viral agents as the cause of diseases classified elsewhere: Secondary | ICD-10-CM

## 2017-03-01 DIAGNOSIS — J069 Acute upper respiratory infection, unspecified: Secondary | ICD-10-CM

## 2017-03-01 NOTE — Progress Notes (Signed)
  Subjective:    Allen Rose is a 42 y.o. old male here for sore throat and hoarse voice Spanish video interpreter with ID number [id number] was used for this encounter. HPI Sore throat and hoarseness: this has been going on for two weeks. Started with cold symptoms for which he was seen here two weeks ago and recommended conservative management.  He says his throat feels dry. He also feels like clearing his throat frequently. Denies fever, shortness of breath, chest pain, nausea or vomiting.  Reports intermittent cough which is usually dry. He reports history of heartburn occasionally. Tried tylenol, mucinex and other medications.  He says he is here today because he is worried about infection. PMH/Problem List: has Common wart; VENEREAL WART; EXOGENOUS OBESITY; DEPRESSIVE DISORDER NOT ELSEWHERE CLASSIFIED; GERD; FATIGUE; Conjunctivitis unspecified; Dental caries; Dysuria; Acanthosis nigricans; Thyroiditis, acute nonsuppurative; Blurred vision, right eye; Nonspecific elevation of level of transaminase or lactic acid dehydrogenase (LDH); Metabolic syndrome; Type 2 diabetes mellitus (HCC); Trapezius muscle spasm; and Viral URI with cough on their problem list.   has no past medical history on file.  FH:  Family History  Problem Relation Age of Onset  . Diabetes Sister     [facility] Social History   Tobacco Use  . Smoking status: Never Smoker  . Smokeless tobacco: Never Used  Substance Use Topics  . Alcohol use: Yes    Comment: rarely: 2-4 times per month  . Drug use: No    Review of Systems Review of systems negative except for pertinent positives and negatives in history of present illness above.     Objective:     Vitals:   03/01/17 1436  BP: 130/80  Pulse: 74  Temp: 98.6 F (37 C)  TempSrc: Oral  SpO2: 96%  Weight: 189 lb (85.7 kg)  Height: 5\' 4"  (1.626 m)   Body mass index is 32.44 kg/m.  Physical Exam  GEN: appears well,  no apparent distress. Head: normocephalic and atraumatic    Eyes: conjunctiva without injection, sclera anicteric Ears: external ear and ear canal normal.  Some erythema over pars tensa on the right.  No notable effusion Nares: no rhinorrhea, congestion or erythema Oropharynx: mmm without erythema or exudation.  No petechiae or cobblestoning.  Uvula midline HEM: negative for cervical or periauricular lymphadenopathies CVS: RRR, nl s1 & s2, no murmurs RESP: no IWOB, good air movement bilaterally, CTAB GI: BS present & normal, soft, NTND, no HSM SKIN: no apparent skin lesion NEURO: alert and oiented appropriately, no gross deficits  PSYCH: euthymic mood with congruent affect     Assessment and Plan:  1. Viral URI with cough/laryngitis: This is likely due to viral infection.  Very low suspicion for strep pharyngitis.  He also reports history of heartburn which might have some contribution.  I discussed about the course of viral URI.  I reassured patient that I have low suspicion for bacterial infection or life-threatening process.  I encouraged him to try a tablespoonful of honey before bedtime.  I also encouraged him to continue good hydration.  Discussed about return precautions as well.  Gave him handout about viral laryngitis.   Return if symptoms worsen or fail to improve.  Almon Herculesaye T Cheronda Erck, MD 03/01/17 Pager: [phone]

## 2017-03-01 NOTE — Patient Instructions (Addendum)
Ronquera (Hoarseness) La ronquera es un cambio anormal de la vozque puede dificultar el habla. La voz puede sonar spera, entrecortada o forzada. La ronquera se debe a un problema con las cuerdas vocales, dos bandas de tejido que se encuentran dentro de la laringe. Al hablar, las cuerdas vocales se mueven hacia atrs y hacia adelante para crear sonidos. Para que la voz sea clara, las superficies de estas cuerdas deben ser suaves. La hinchazn o la presencia de ndulos en las cuerdas vocales pueden causar ronquera. Las causas frecuentes de problemas en las cuerdas vocales incluyen las siguientes:  Infeccin de las vas respiratorias superiores.  Tos prolongada.  Esforzar o Production designer, theatre/television/filmusar la voz en exceso.  Fumar.  Alergias.  Tumores en las cuerdas vocales.  Reflujo retrgrado de los cidos estomacales que irritan las cuerdas vocales (reflujo gastroesofgico). INSTRUCCIONES PARA EL CUIDADO EN EL HOGAR Controle su afeccin para ver si hay cambios. Para aliviar cualquier molestia que tenga, haga lo siguiente:  Ponga la voz en reposo. No susurre. Susurrar puede causar esfuerzo muscular.  No hable en voz alta ni con voz spera lo cual empeora la ronquera.  No consuma ningn producto que contenga tabaco, lo que incluye cigarrillos, tabaco de Theatre managermascar o Administrator, Civil Servicecigarrillos electrnicos. Si necesita ayuda para dejar de fumar, consulte al American Expressmdico.  Evite ser un fumador pasivo.  No consuma alimentos que le causen [place] Down[place] Country Clubacidez, ya que este trastorno puede empeorar el reflujo gastroesofgico.  No beba caf.  No beba alcohol.  Beba suficiente lquido para Photographermantener la orina clara o de color amarillo plido.  Use un humidificador si el aire de su casa es seco. SOLICITE ATENCIN MDICA SI:  La ronquera se prolonga durante ms de 3semanas.  Est casi o totalmente afnico durante ms de 3das.  Siente dolor al tragar o cuando trata de hablar.  Se palpa un bulto en el cuello. SOLICITE ATENCIN MDICA DE  INMEDIATO SI:  Presenta dificultad para tragar.  Siente que se ahoga cuando traga.  Expectora o vomita sangre.  Tiene dificultad para respirar. Esta informacin no tiene Theme park managercomo fin reemplazar el consejo del mdico. Asegrese de hacerle al mdico cualquier pregunta que tenga. Document Released: 09/14/2010 Document Revised: 05/19/2014 Document Reviewed: 12/24/2013 Elsevier Interactive Patient Education  [employer]2018 Elsevier Inc.

## 2017-03-29 ENCOUNTER — Ambulatory Visit: Payer: Self-pay | Admitting: Nurse Practitioner

## 2017-03-29 VITALS — BP 130/75 | HR 69 | Temp 98.3°F | Resp 16 | Wt 190.0 lb

## 2017-03-29 DIAGNOSIS — K219 Gastro-esophageal reflux disease without esophagitis: Secondary | ICD-10-CM

## 2017-03-29 MED ORDER — OMEPRAZOLE 20 MG PO CPDR: 20.0000 mg | DELAYED_RELEASE_CAPSULE | Freq: Every day | ORAL | 1 refills | Status: DC

## 2017-03-29 NOTE — Patient Instructions (Addendum)
Enfermedad de reflujo gastroesofágico en los adultos  Gastroesophageal Reflux Disease, Adult  Normalmente, la comida baja por el esófago y se mantiene en el estómago, donde se la digiere. Sin embargo, cuando una persona tiene enfermedad de reflujo gastroesofágico (ERGE), la comida y jugo gástrico (ácido estomacal) suben por el esófago. Cuando esto ocurre, el esófago se inflama y duele. Con el tiempo, la ERGE puede producir pequeños agujeros (úlceras) en el revestimiento del esófago.  ¿Cuáles son las causas?  Esta afección se debe a un problema en el músculo que se encuentra entre el esófago y el estómago (esfínter esofágico inferior, o EEI). Normalmente, el EEI se cierra una vez que la comida pasa a través del esófago hasta el estómago. Cuando el EEI se encuentra debilitado o tiene alguna anomalía, no se cierra por completo, y eso permite que tanto la comida como el jugo gástrico, que es ácido, vuelvan a subir por el esófago. El EEI puede debilitarse a causa de ciertas sustancias alimenticias, medicamentos y afecciones, que incluyen:  · Consumo de tabaco.  · Embarazo.  · Tener una hernia de hiato.  · Consumo excesivo de alcohol.  · Ciertos alimentos y bebidas, como café, chocolate, cebollas y menta.    ¿Qué incrementa el riesgo?  Es más probable que esta afección se manifieste en:  · Personas con aumento del peso corporal.  · Personas con trastornos del tejido conjuntivo.  · Personas que toman antiinflamatorios no esteroideos (AINE).    ¿Cuáles son los signos o los síntomas?  Los síntomas de esta afección incluyen lo siguiente:  · Acidez estomacal.  · Dificultad o dolor al tragar.  · Sensación de tener un bulto en la garganta.  · Sabor amargo en la boca.  · Mal aliento.  · Gran cantidad de saliva.  · Estómago inflamado o con malestar.  · Eructos.  · Dolor en el pecho.  · Dificultad para respirar o sibilancias.  · Tos constante (crónica) o tos nocturna.  · Desgaste del esmalte dental.  · Pérdida de peso.     El dolor de pecho puede deberse a distintas enfermedades. Es importante que consulte al médico si tiene dolor de pecho.  ¿Cómo se diagnostica?  El médico le hará una historia clínica y un examen físico. Para determinar si tiene ERGE leve o grave, el médico también puede controlar cómo usted reacciona al tratamiento. También pueden hacerle otros estudios, por ejemplo:  · Una endoscopía para examinarle el estómago y el esófago con una cámara pequeña.  · Una prueba para medir el grado de acidez en el esófago.  · Una prueba para medir cuánta presión hay en el esófago.  · Un estudio de tránsito baritado regular o modificado para ver la forma, el tamaño y el funcionamiento del esófago.    ¿Cómo se trata?  El objetivo del tratamiento es ayudar a aliviar los síntomas y evitar las complicaciones. El tratamiento de esta afección puede variar según la gravedad de los síntomas. El médico puede recomendarle lo siguiente:  · Cambios en la dieta.  · Medicamentos.  · Someterse a una cirugía.    Siga estas instrucciones en su casa:  Dieta  · Siga la dieta recomendada por el médico. Esto puede incluir evitar ciertos alimentos y bebidas, por ejemplo:  ? Café y té (con o sin cafeína).  ? Bebidas que contengan alcohol.  ? Bebidas energéticas y deportivas.  ? Bebidas gaseosas y refrescos.  ? Chocolate y cacao.  ? Menta y esencia de menta.  ?   pizza con salsa de tomate. ? Alimentos fritos y [place] Springsgrasos, Cazenoviacomo donas, papas fritas y aderezos ricos en grasas. ? Carnes con alto contenido de grasa, como salchichas, y cortes de carnes rojas y blancas con mucha grasa, por ejemplo, chuletas o costillas, embutidos, jamn y  tocino. ? Productos lcteos ricos en grasas, como leche [place] Mariaentera, Romemanteca y Sharpsburgqueso crema.  Haga varias comidas pequeas y frecuentes Freight forwarderdurante el da en lugar de comidas abundantes.  Evite beber grandes cantidades de lquidos con las comidas.  Evite comer 2 o 3horas antes de acostarse.  Evite recostarse inmediatamente despus de comer.  No haga ejercicios enseguida despus de comer. Instrucciones generales  Est atento a cualquier cambio en los sntomas.  CenterPoint Energyome los medicamentos de venta libre y los recetados solamente como se lo haya indicado el mdico. No tome aspirina, ibuprofeno ni otros antiinflamatorios no esteroideos (AINE) a menos que el mdico se lo indique.  No consuma ningn producto que contenga tabaco, lo que incluye cigarrillos, tabaco de Theatre managermascar y Administrator, Civil Servicecigarrillos electrnicos. Si necesita ayuda para dejar de fumar, consulte al mdico.  Use ropas sueltas. No use nada apretado alrededor de la cintura que haga presin sobre el abdomen.  Levante (eleve) la cabecera de la cama 6pulgadas (15cm).  Trate de reducir J. C. Penneyel nivel de estrs con actividades tales como el yoga o la meditacin. Si necesita ayuda para reducir J. C. Penneyel nivel de estrs, consulte al mdico.  Si tiene sobrepeso, baje hasta llegar a un peso saludable para usted. Pdale consejos al mdico para bajar de peso de Tim[place]manera segura.  Concurra a todas las visitas de control como se lo haya indicado el mdico. Esto es importante.  [facility] the next 24 hours for abnormal lung sound.  Information was translated for patient by Tonna BoehringerMariela W., CMA.  Patient verbalized understanding during visit.  Comunquese con un mdico si:  Aparecen nuevos sntomas.  Baja de peso sin causa aparente.  Tiene dificultad para tragar, o le duele cuando traga.  Tiene tos persistente o sibilancias.  Los sntomas no mejoran con Scientist, research (medical)el tratamiento.  Tiene la voz ronca. Solicite ayuda de inmediato  si:  Goldman SachsSiente dolor en los brazos, el cuello, la [place]mandbula, los dientes o la espalda.  Se siente transpirado, mareado o tiene una sensacin de desvanecimiento.  Siente falta de aire o Journalist, newspaperdolor en el pecho.  Vomita y el vmito tiene un aspecto similar a la sangre o a los granos de caf.  Se desmaya.  Las heces son sanguinolentas o negras.  No puede tragar, beber o comer. Esta informacin no tiene Theme park managercomo fin reemplazar el consejo del mdico. Asegrese de hacerle al mdico cualquier pregunta que tenga. Document Released: 10/12/2004 Document Revised: 04/07/2016 Document Reviewed: 04/29/2014 Elsevier Interactive Patient Education  [employer]2018 Elsevier Inc.

## 2017-03-29 NOTE — Progress Notes (Signed)
Subjective:     History was provided by the patient. Allen Rose is a 42 y.o. male who presents for evaluation of a sore throat. Associated symptoms include sore throat. Onset of symptoms was over a month ago.  Patient was seen on 2 separate occassions and diagnosed with an URI.  Since that time, symptoms have not improved and gradually worsened.  Today, patient presents with continued sore throat, and hoarseness.  Patient also complains of nasal congestion.  Patient denies fever, sneezing, cough or ear pain.  Patient has taken Mucinex without improvement.  Patient also endorses drinking teas with honey, lemon, garlic, etc with no improvement.  Patient states throat pain is worse in the morning and at night.   He has not had recent close exposure to someone with proven streptococcal pharyngitis or other illness.    Patient with history of GERD and questionable DM.  Patient was previously diagnosed with DM, and took medication for a short period of time.  Patient states he followed up with PCP and was subsequently told he no longer had DM.  Patient is spanish speaking and obvious language barrier noted during this visit.  Most likely impeded patient understanding of illness(es) during previous visits.  The following portions of the patient's history were reviewed and updated as appropriate: allergies, current medications and past medical history.  Review of Systems Constitutional: negative Eyes: negative Ears, nose, mouth, throat, and face: positive for hoarseness, nasal congestion and sore throat, negative for ear drainage and earaches Respiratory: positive for cough and sputum, negative for asthma, dyspnea on exertion, pneumonia, stridor and wheezing Cardiovascular: negative Gastrointestinal: positive for reflux symptoms, negative for abdominal pain, constipation, diarrhea, nausea and vomiting Neurological: positive for headaches, negative for coordination problems, dizziness, paresthesia,  vertigo and weakness Allergic/Immunologic: negative    Objective:    BP 130/75 (BP Location: Right Arm, Patient Position: Sitting, Cuff Size: Normal)   Pulse 69   Temp 98.3 F (36.8 C) (Oral)   Resp 16   Wt 190 lb (86.2 kg)   SpO2 98%   BMI 32.61 kg/m  General appearance: alert, cooperative, no distress and constant throat clearing, patient had mild difficulty speaking in complete sentences Head: Normocephalic, without obvious abnormality, atraumatic Eyes: conjunctivae/corneas clear. PERRL, EOM's intact. Fundi benign. Ears: normal TM's and external ear canals both ears Nose: no discharge, turbinates swollen, inflamed, no sinus tenderness Throat: abnormal findings: mild oropharyngeal erythema and moderate oropharyngeal erythema Lungs: rubs posterior - LUL, did not clear with cough Heart: regular rate and rhythm, S1, S2 normal, no murmur, click, rub or gallop Abdomen: soft, non-tender; bowel sounds normal; no masses,  no organomegaly Pulses: 2+ and symmetric Skin: Skin color, texture, turgor normal. No rashes or lesions Lymph nodes: cervical and submandibular nodes normal Neurologic: Grossly normal    Assessment:    Possible GERD. Unable to perform chest x-ray to rule out PNA, Endocarditis, Cardiomegaly, etc..    Plan:    Patient given prescription for Prilosec.  [facility] for abnormal lung sounds.  Patient instructed with interpreter present and verbalized understanding at this time. Patient instructed to follow up within 24 hours for chest x-ray.

## 2017-03-30 ENCOUNTER — Other Ambulatory Visit: Payer: Self-pay

## 2017-03-30 ENCOUNTER — Ambulatory Visit (HOSPITAL_COMMUNITY)
Admission: EM | Admit: 2017-03-30 | Discharge: 2017-03-30 | Disposition: A | Discharge status: Home / Self Care | Payer: BLUE CROSS/BLUE SHIELD | Attending: Emergency Medicine | Admitting: Emergency Medicine

## 2017-03-30 ENCOUNTER — Encounter (HOSPITAL_COMMUNITY): Payer: Self-pay | Admitting: Emergency Medicine

## 2017-03-30 ENCOUNTER — Ambulatory Visit (INDEPENDENT_AMBULATORY_CARE_PROVIDER_SITE_OTHER): Payer: BLUE CROSS/BLUE SHIELD

## 2017-03-30 DIAGNOSIS — R05 Cough: Secondary | ICD-10-CM

## 2017-03-30 DIAGNOSIS — R059 Cough, unspecified: Secondary | ICD-10-CM

## 2017-03-30 DIAGNOSIS — K219 Gastro-esophageal reflux disease without esophagitis: Secondary | ICD-10-CM

## 2017-03-30 MED ORDER — RANITIDINE HCL 150 MG PO TABS: 150.0000 mg | ORAL_TABLET | Freq: Two times a day (BID) | ORAL | 0 refills | Status: DC

## 2017-03-30 NOTE — ED Provider Notes (Signed)
HPI  SUBJECTIVE:  Allen Rose is a 42 y.o. male who presents for a chest x-ray. Patient seen yesterday by PMD for for sore throat x 1 month, found to have  rubs in the left upper lobe, was instructed to follow-up here today to obtain chest x-ray to rule out pneumonia, cardiomegaly.  Ultimately thought to have GERD.  Started on Prilosec.   Patient tells me that he has had hoarseness and a dry, sore throat for 2 months.  He reports a cough occasionally productive of white yellow phlegm.  He denies fevers, night sweats, chest pain, wheezing, shortness of breath, unintentional weight loss.  He reports belching, water brash.  No nasal congestion, postnasal drip, shortness of breath with exertion, sinus pain or pressure.  He states that the Prilosec has helped a sore throat, he is also tried honey ginger tea, Mucinex DM, and other herbal home remedies which have helped with the cough.  No aggravating factors.  No history of asthma, eczema, COPD.  He is a former smoker.  He has a past medical history of diabetes which has resolved with lifestyle changes, GERD.  No history of hypertension, cancer, CHF.  PMD: [facility].   All history obtained through language line.  History reviewed. No pertinent past medical history.  History reviewed. No pertinent surgical history.  Family History  Problem Relation Age of Onset  . Diabetes Sister     Social History   Tobacco Use  . Smoking status: Never Smoker  . Smokeless tobacco: Never Used  Substance Use Topics  . Alcohol use: Yes    Comment: rarely: 2-4 times per month  . Drug use: No    No current facility-administered medications for this encounter.   Current Outpatient Medications:  .  omeprazole (PRILOSEC) 20 MG capsule, Take 1 capsule (20 mg total) by mouth daily., Disp: 30 capsule, Rfl: 1 .  Pseudoephedrine-Guaifenesin (GUAIFENESIN-PSEUDOEPHEDRINE PO), Take 600 mg by mouth., Disp: , Rfl:  .  ranitidine (ZANTAC) 150 MG  tablet, Take 1 tablet (150 mg total) by mouth 2 (two) times daily., Disp: 60 tablet, Rfl: 0  No Known Allergies   ROS  As noted in HPI.   Physical Exam  BP (!) 133/91 (BP Location: Left Arm)   Pulse 71   Temp 98.7 F (37.1 C) (Oral)   SpO2 98%   Constitutional: Well developed, well nourished, no acute distress Eyes:  EOMI, conjunctiva normal bilaterally HENT: Normocephalic, atraumatic,mucus membranes moist.  oropharynx normal. Respiratory: Normal inspiratory effort.  Lungs clear bilaterally.  Good air movement Cardiovascular: Normal rate regular rhythm, no murmurs, rubs, gallops GI: nondistended skin: No rash, skin intact Musculoskeletal: no deformities Neurologic: Alert & oriented x 3, no focal neuro deficits Psychiatric: Speech and behavior appropriate   ED Course   Medications - No data to display  Orders Placed This Encounter  Procedures  . DG Chest 2 View    Standing Status:   Standing    Number of Occurrences:   1    Order Specific Question:   Reason for Exam (SYMPTOM  OR DIAGNOSIS REQUIRED)    Answer:   cough x 2 months    No results found for this or any previous visit (from the past 24 hour(s)). Dg Chest 2 View  Result Date: 03/30/2017 CLINICAL DATA:  Productive cough for 2 months. EXAM: CHEST - 2 VIEW COMPARISON:  None. FINDINGS: The lungs are clear. Heart size is normal. No pneumothorax or pleural fluid. No bony abnormality. IMPRESSION:  Negative chest. Electronically Signed   By: Drusilla Kanner M.D.   On: 03/30/2017 10:36    ED Clinical Impression  Cough  Gastroesophageal reflux disease, esophagitis presence not specified   ED Assessment/Plan  Outside records reviewed as noted in HPI.  Reviewed imaging independently.  Normal chest x-ray.  See radiology report for full details.  Feel that his sx are most likely from GERD.  Will add Zantac to the Prilosec.  He has follow-up with his primary care physician next Wednesday at 8:15 in the morning.   We will also write a 2-day work note per patient request.  Using the language line, discussed imaging, MDM, plan and followup with patient. Discussed sn/sx that should prompt return to the ED. patient agrees with plan.   Meds ordered this encounter  Medications  . ranitidine (ZANTAC) 150 MG tablet    Sig: Take 1 tablet (150 mg total) by mouth 2 (two) times daily.    Dispense:  60 tablet    Refill:  0    *This clinic note was created using Scientist, clinical (histocompatibility and immunogenetics). Therefore, there may be occasional mistakes despite careful proofreading.   ?   Domenick Gong, MD 03/31/17 [phone]

## 2017-03-30 NOTE — ED Triage Notes (Signed)
Pt here for f/u for xray for abnormal lung sounds at his PCP yesterday.

## 2017-03-30 NOTE — Discharge Instructions (Signed)
I feel that your cough is most likely coming from the acid reflux.  Start the Zantac in addition to the Prilosec.  It is okay to continue taking the Mucinex.

## 2017-04-02 ENCOUNTER — Telehealth: Payer: Self-pay

## 2017-04-02 NOTE — Telephone Encounter (Signed)
Called and spoke with pt to see how he was feeling since his visit with us and he states he is feeling better.

## 2017-04-04 ENCOUNTER — Ambulatory Visit: Payer: BLUE CROSS/BLUE SHIELD | Admitting: Internal Medicine

## 2017-04-04 ENCOUNTER — Other Ambulatory Visit: Payer: Self-pay

## 2017-04-04 ENCOUNTER — Encounter: Payer: Self-pay | Admitting: Internal Medicine

## 2017-04-04 VITALS — BP 120/80 | HR 66 | Temp 98.7°F | Ht 64.0 in | Wt 189.0 lb

## 2017-04-04 DIAGNOSIS — R490 Dysphonia: Secondary | ICD-10-CM

## 2017-04-04 DIAGNOSIS — E119 Type 2 diabetes mellitus without complications: Secondary | ICD-10-CM | POA: Diagnosis not present

## 2017-04-04 LAB — POCT GLYCOSYLATED HEMOGLOBIN (HGB A1C): Hemoglobin A1C: 5.5

## 2017-04-04 NOTE — Assessment & Plan Note (Signed)
Ongoing for 3 months. Has had intermittent cough and sore throat first thing in AM, however hoarseness seems to have preceded these symptoms. Cough and sore throat also are not persistent, and hoarseness is. Patient does have history of smoking, however no red flags noted today. Does have GERD but has been taking omeprazole for a while and addition of ranitidine has not improved symptoms either, so think that cough and/or hoarseness are less likely due to GERD. No abnormalities noted on throat or lung exam today. Given duration of hoarseness, will refer to ENT for further work-up.  - Referral to ENT placed

## 2017-04-04 NOTE — Patient Instructions (Signed)
It was nice meeting you today Mr. Allen Rose!  I have placed a referral to the ears, nose, and throat (ENT) doctor for you due to your hoarseness. They will call you with the date and time of this appointment.   In the meantime, please continue taking all the medications you have been.   If you have any questions or concerns, please feel free to call the clinic.   Be well,  Dr. Natale MilchLancaster

## 2017-04-04 NOTE — Progress Notes (Signed)
   Subjective:   Patient: Shauna HughJose Reyes-Menjivar       Birthdate: 03/02/1975       MRN: [id number]      HPI  Shauna HughJose Reyes-Menjivar is a 42 y.o. male presenting for DM f/u and hoarseness.   Type II DM Currently controlled with diet and exercise alone. Was taking metformin but this was discontinued >6 mo ago. Does not check CBG at home. Dietary changes include no sodas or fried food and cooking only with olive oil. Also goes to gym and walks daily. Drinks 8 bottles of water per day.   Hoarseness Constant for past 3 months. Says he woke up one morning and was hoarse, and symptoms have not improved. Also with intermittent cough that he says is not bothersome and sore throat first thing in the morning sometimes. Has history of GERD for which he has been taking omeprazole and ranitidine. Says helps with GERD symptoms but has not improved hoarseness or cough. Has been taking Mucinex as well as honey with lime and a ginger, onion, and garlic stew with no improvement in symptoms. Denies fevers, unintentional weight loss, SOB. Is a former smoker.  Was seen in ED for this issue on 03/15. CXR performed at that time with no abnormalities. Sx thought most likely 2/2 GERD, so he was prescribed ranitidine in addition to omeprazole and told to f/u with PCP as planned.    Smoking status reviewed. Patient is former smoker.   Review of Systems See HPI.     Objective:  Physical Exam  Constitutional: He is oriented to person, place, and time and well-developed, well-nourished, and in no distress.  Hoarse voice  HENT:  Head: Normocephalic and atraumatic.  Nose: Nose normal.  Mouth/Throat: Oropharynx is clear and moist. No oropharyngeal exudate.  Pulmonary/Chest: Effort normal and breath sounds normal. No respiratory distress. He has no wheezes.  Neurological: He is alert and oriented to person, place, and time.  Skin: Skin is warm and dry.  Psychiatric: Affect and judgment normal.     Assessment & Plan:  Type 2  diabetes mellitus Remains very well-controlled with diet and exercise alone. A1C remains below goal at 5.5 today. As such, no need to resume metformin.  - Repeat A1C in 6 mo  Persistent hoarseness Ongoing for 3 months. Has had intermittent cough and sore throat first thing in AM, however hoarseness seems to have preceded these symptoms. Cough and sore throat also are not persistent, and hoarseness is. Patient does have history of smoking, however no red flags noted today. Does have GERD but has been taking omeprazole for a while and addition of ranitidine has not improved symptoms either, so think that cough and/or hoarseness are less likely due to GERD. No abnormalities noted on throat or lung exam today. Given duration of hoarseness, will refer to ENT for further work-up.  - Referral to ENT placed   Tarri AbernethyAbigail J Keyarah Mcroy, MD, MPH PGY-3 [facility] Pager [phone]

## 2017-04-04 NOTE — Assessment & Plan Note (Signed)
Remains very well-controlled with diet and exercise alone. A1C remains below goal at 5.5 today. As such, no need to resume metformin.  - Repeat A1C in 6 mo

## 2017-04-25 LAB — HM DIABETES EYE EXAM

## 2017-05-10 ENCOUNTER — Encounter: Payer: Self-pay | Admitting: Family Medicine

## 2017-12-06 ENCOUNTER — Encounter: Payer: Self-pay | Admitting: Family Medicine

## 2017-12-06 ENCOUNTER — Ambulatory Visit (INDEPENDENT_AMBULATORY_CARE_PROVIDER_SITE_OTHER): Payer: BLUE CROSS/BLUE SHIELD | Admitting: Family Medicine

## 2017-12-06 VITALS — BP 126/82 | HR 74 | Temp 98.1°F | Wt 183.0 lb

## 2017-12-06 DIAGNOSIS — E119 Type 2 diabetes mellitus without complications: Secondary | ICD-10-CM | POA: Diagnosis not present

## 2017-12-06 DIAGNOSIS — Z23 Encounter for immunization: Secondary | ICD-10-CM | POA: Diagnosis not present

## 2017-12-06 LAB — POCT GLYCOSYLATED HEMOGLOBIN (HGB A1C): HBA1C, POC (PREDIABETIC RANGE): 5.8 % (ref 5.7–6.4)

## 2017-12-06 NOTE — Progress Notes (Signed)
   Subjective:   Patient ID: Allen Rose    DOB: 02/18/1975, 42 y.o. male   MRN: [id number]  CC: f/u T2DM, flu shot  HPI: Allen Rose is a 42 y.o. male who presents to clinic today for the following issues.  T2DM Last A1c 5.5 in 03/2017.  Feels well controlled.  He does not check his sugars at home and is not on medications.    Diet: no sodas, drinks orange juice watered down 50%.  Eats primarily [place] food, tortilla, eats more grilled and baked meats, nothing fried, eats fruits and vegetables  Exercise: gym several times a week, works outside doing [employer], has been watching his diet.   ROS: No fever, chills, nausea, vomiting.  No CP, SOB, abdominal pain.    Social: pt is a never smoker.  Medications reviewed. Objective:   BP 126/82   Pulse 74   Temp 98.1 F (36.7 C) (Oral)   Wt 183 lb (83 kg)   SpO2 98%   BMI 31.41 kg/m  Vitals and nursing note reviewed.  General: Pleasant, 42 year old male, NAD Neck: supple CV: RRR no MRG  Lungs: CTAB, normal effort  Abdomen: soft, NTND, +bs  Skin: warm, dry, no rash Extremities: warm and well perfused Neuro: alert, oriented x3, no focal deficits   Assessment & Plan:   Type 2 diabetes mellitus Remains well-controlled off medication A1c 5.8 today from 5.5 in 03/2017.  Recent eye exam shows no retinopathy.  Encouraged him to continue current diet and regular exercise as he has been doing great with both.  Plan to repeat in 6 months  Health maintenance: -flu shot given today  Orders Placed This Encounter  Procedures  . POCT HgB A1C (CPT 83036)   Follow-up: 7278-month  Freddrick MarchYashika Christasia Angeletti, MD [facility]

## 2017-12-06 NOTE — Patient Instructions (Signed)
It was nice seeing you again today.  You were seen in clinic for follow-up of your diabetes and we checked A1c today which was 5.8  Since your diabetes is well controlled, we will continue to monitor you off medications for now.  Continue eating and exercising the way that you have been as both of these will help maintain your blood sugars.  You also received your flu shot today.  Please call clinic if you have any questions.  Be well, Freddrick MarchYashika Cattie Tineo MD

## 2017-12-06 NOTE — Assessment & Plan Note (Signed)
Remains well-controlled off medication A1c 5.8 today from 5.5 in 03/2017.  Recent eye exam shows no retinopathy.  Encouraged him to continue current diet and regular exercise as he has been doing great with both.  Plan to repeat in 6 months

## 2018-06-24 ENCOUNTER — Ambulatory Visit: Payer: BLUE CROSS/BLUE SHIELD

## 2018-06-24 ENCOUNTER — Other Ambulatory Visit: Payer: Self-pay

## 2018-07-21 ENCOUNTER — Encounter (HOSPITAL_COMMUNITY): Payer: Self-pay | Admitting: Emergency Medicine

## 2018-07-21 ENCOUNTER — Emergency Department (HOSPITAL_COMMUNITY): Payer: BLUE CROSS/BLUE SHIELD

## 2018-07-21 ENCOUNTER — Emergency Department (HOSPITAL_COMMUNITY)
Admission: EM | Admit: 2018-07-21 | Discharge: 2018-07-21 | Disposition: A | Discharge status: Home / Self Care | Payer: BLUE CROSS/BLUE SHIELD | Attending: Emergency Medicine | Admitting: Emergency Medicine

## 2018-07-21 ENCOUNTER — Other Ambulatory Visit: Payer: Self-pay

## 2018-07-21 DIAGNOSIS — Y999 Unspecified external cause status: Secondary | ICD-10-CM | POA: Diagnosis not present

## 2018-07-21 DIAGNOSIS — S0990XA Unspecified injury of head, initial encounter: Secondary | ICD-10-CM | POA: Diagnosis present

## 2018-07-21 DIAGNOSIS — S0102XA Laceration with foreign body of scalp, initial encounter: Secondary | ICD-10-CM | POA: Diagnosis not present

## 2018-07-21 DIAGNOSIS — Z79899 Other long term (current) drug therapy: Secondary | ICD-10-CM | POA: Diagnosis not present

## 2018-07-21 DIAGNOSIS — S0101XA Laceration without foreign body of scalp, initial encounter: Secondary | ICD-10-CM

## 2018-07-21 DIAGNOSIS — Y929 Unspecified place or not applicable: Secondary | ICD-10-CM | POA: Diagnosis not present

## 2018-07-21 DIAGNOSIS — Y939 Activity, unspecified: Secondary | ICD-10-CM | POA: Insufficient documentation

## 2018-07-21 DIAGNOSIS — E119 Type 2 diabetes mellitus without complications: Secondary | ICD-10-CM | POA: Diagnosis not present

## 2018-07-21 MED ORDER — LIDOCAINE-EPINEPHRINE (PF) 2 %-1:200000 IJ SOLN
10.0000 mL | Freq: Once | INTRAMUSCULAR | Status: DC
  Filled 2018-07-21: qty 20
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
## 2018-07-21 NOTE — ED Notes (Signed)
Patient verbalizes understanding of discharge instructions. Opportunity for questioning and answers were provided. Armband removed by staff, pt discharged from ED.  
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
## 2018-07-21 NOTE — ED Notes (Signed)
Pt back from CT and awaiting results for D/C

## 2018-07-26 ENCOUNTER — Ambulatory Visit: Payer: BLUE CROSS/BLUE SHIELD
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
## 2021-04-14 ENCOUNTER — Ambulatory Visit (HOSPITAL_COMMUNITY)
Admission: EM | Admit: 2021-04-14 | Discharge: 2021-04-14 | Disposition: A | Discharge status: Home / Self Care | Payer: 59 | Attending: Physician Assistant | Admitting: Physician Assistant

## 2021-04-14 ENCOUNTER — Encounter (HOSPITAL_COMMUNITY): Payer: Self-pay

## 2021-04-14 DIAGNOSIS — Z9109 Other allergy status, other than to drugs and biological substances: Secondary | ICD-10-CM

## 2021-04-14 DIAGNOSIS — R490 Dysphonia: Secondary | ICD-10-CM | POA: Diagnosis not present

## 2021-04-14 DIAGNOSIS — J029 Acute pharyngitis, unspecified: Secondary | ICD-10-CM

## 2021-04-14 LAB — POCT RAPID STREP A, ED / UC: Streptococcus, Group A Screen (Direct): NEGATIVE

## 2021-04-14 MED ORDER — FLUTICASONE PROPIONATE 50 MCG/ACT NA SUSP: 1.0000 | Freq: Every day | NASAL | 0 refills | Status: DC

## 2021-04-14 MED ORDER — LEVOCETIRIZINE DIHYDROCHLORIDE 5 MG PO TABS: 5.0000 mg | ORAL_TABLET | Freq: Every evening | ORAL | 0 refills | Status: DC

## 2021-04-14 NOTE — Discharge Instructions (Signed)
Your strep was negative.  I believe that your symptoms are related to allergies.  Please start levocetirizine at night.  Start Flonase 1 spray in each nostril in the morning.  I think it is reasonable to follow-up with an ENT.  Please call them to schedule an appointment.  If you have any worsening symptoms including difficulty speaking, difficulty swallowing, worsening sore throat, shortness of breath, swelling of your throat, fever you need to be seen immediately. ?

## 2021-04-14 NOTE — ED Provider Notes (Signed)
?[place] ? ? ? ?CSN: [id number] ?Arrival date & time: 04/14/21  1606 ? ? ?  ? ?History   ?Chief Complaint ?No chief complaint on file. ? ? ?HPI ?Allen Rose is a 46 y.o. male.  ? ?Patient presents today with a weeklong history of nasal congestion and sore throat.  He is Spanish-speaking but interpreter was utilized during the visit.  He reports associated cough.  He has tried ginger tea as well as increasing fluids without improvement of symptoms.  He is eating and drinking normally despite symptoms.  He denies any known sick contacts.  He has tried Benadryl as well as NyQuil without improvement of symptoms.  He denies formal diagnosis of allergies.  He does not smoke.  Denies history of asthma.  He also reports hoarseness that has been ongoing.  He has not seen an ENT in the past. ? ? ?History reviewed. No pertinent past medical history. ? ?Patient Active Problem List  ? Diagnosis Date Noted  ? Persistent hoarseness 04/04/2017  ? Viral URI with cough 02/14/2017  ? Trapezius muscle spasm 01/26/2016  ? Type 2 diabetes mellitus ([place]) 10/01/2014  ? Metabolic syndrome [id number]  ? Nonspecific elevation of level of transaminase or lactic acid dehydrogenase (LDH) 03/04/2014  ? Blurred vision, right eye 02/27/2014  ? Thyroiditis, acute nonsuppurative 07/14/2013  ? Dysuria 07/11/2013  ? Acanthosis nigricans 07/11/2013  ? Dental caries 02/01/2013  ? Conjunctivitis unspecified 08/18/2012  ? GERD 08/05/2007  ? FATIGUE 08/05/2007  ? Common wart 07/15/2007  ? VENEREAL WART 07/15/2007  ? EXOGENOUS OBESITY 07/15/2007  ? DEPRESSIVE DISORDER NOT ELSEWHERE CLASSIFIED 07/15/2007  ? ? ?History reviewed. No pertinent surgical history. ? ? ? ? ?Home Medications   ? ?Prior to Admission medications   ?Medication Sig Start Date End Date Taking? Authorizing Provider  ?fluticasone (FLONASE) 50 MCG/ACT nasal spray Place 1 spray into both nostrils daily. 04/14/21  Yes Jamel Holzmann, Derry Skill, PA-C  ?levocetirizine (XYZAL ALLERGY  24HR) 5 MG tablet Take 1 tablet (5 mg total) by mouth every evening. 04/14/21  Yes Gaby Harney, Junie Panning K, PA-C  ?omeprazole (PRILOSEC) 20 MG capsule Take 1 capsule (20 mg total) by mouth daily. 03/29/17 04/28/17  Leath-Warren, Alda Lea, NP  ?Pseudoephedrine-Guaifenesin (GUAIFENESIN-PSEUDOEPHEDRINE PO) Take 600 mg by mouth.    [provider]  ?ranitidine (ZANTAC) 150 MG tablet Take 1 tablet (150 mg total) by mouth 2 (two) times daily. 03/30/17   Melynda Ripple, MD  ? ? ?Family History ?Family History  ?Problem Relation Age of Onset  ? Diabetes Sister   ? ? ?Social History ?Social History  ? ?Tobacco Use  ? Smoking status: Never  ? Smokeless tobacco: Never  ?Substance Use Topics  ? Alcohol use: Yes  ?  Comment: rarely: 2-4 times per month  ? Drug use: No  ? ? ? ?Allergies   ?Patient has no known allergies. ? ? ?Review of Systems ?Review of Systems  ?Constitutional:  Positive for activity change. Negative for appetite change, fatigue and fever.  ?HENT:  Positive for congestion, sore throat and voice change. Negative for sinus pressure, sneezing and trouble swallowing.   ?Respiratory:  Positive for cough. Negative for shortness of breath.   ?Cardiovascular:  Negative for chest pain.  ?Gastrointestinal:  Negative for abdominal pain, diarrhea, nausea and vomiting.  ?Neurological:  Negative for dizziness, light-headedness and headaches.  ? ? ?Physical Exam ?Triage Vital Signs ?ED Triage Vitals  ?Enc Vitals Group  ?   BP 04/14/21 1642 (!) 143/92  ?  Pulse Rate 04/14/21 1642 75  ?   Resp 04/14/21 1642 17  ?   Temp 04/14/21 1643 98.2 ?F (36.8 ?C)  ?   Temp Source 04/14/21 1643 Oral  ?   SpO2 04/14/21 1642 96 %  ?   Weight --   ?   Height --   ?   Head Circumference --   ?   Peak Flow --   ?   Pain Score 04/14/21 1642 0  ?   Pain Loc --   ?   Pain Edu? --   ?   Excl. in [place]? --   ? ?No data found. ? ?Updated Vital Signs ?BP (!) 143/92 (BP Location: Left Arm)   Pulse 75   Temp 98.2 ?F (36.8 ?C) (Oral)   Resp 17   SpO2  96%  ? ?Visual Acuity ?Right Eye Distance:   ?Left Eye Distance:   ?Bilateral Distance:   ? ?Right Eye Near:   ?Left Eye Near:    ?Bilateral Near:    ? ?Physical Exam ?Vitals reviewed.  ?Constitutional:   ?   General: He is awake.  ?   Appearance: Normal appearance. He is well-developed. He is not ill-appearing.  ?   Comments: Very pleasant male appears stated age in no acute distress sitting comfortably in exam room  ?HENT:  ?   Head: Normocephalic and atraumatic.  ?   Right Ear: Tympanic membrane, ear canal and external ear normal. Tympanic membrane is not erythematous or bulging.  ?   Left Ear: Tympanic membrane, ear canal and external ear normal. Tympanic membrane is not erythematous or bulging.  ?   Nose: Nose normal.  ?   Mouth/Throat:  ?   Pharynx: Uvula midline. Posterior oropharyngeal erythema present. No oropharyngeal exudate or uvula swelling.  ?Cardiovascular:  ?   Rate and Rhythm: Normal rate and regular rhythm.  ?   Heart sounds: Normal heart sounds, S1 normal and S2 normal. No murmur heard. ?Pulmonary:  ?   Effort: Pulmonary effort is normal. No accessory muscle usage or respiratory distress.  ?   Breath sounds: Normal breath sounds. No stridor. No wheezing, rhonchi or rales.  ?   Comments: Clear to auscultation bilaterally ?Abdominal:  ?   General: Bowel sounds are normal.  ?   Palpations: Abdomen is soft.  ?   Tenderness: There is no abdominal tenderness.  ?Neurological:  ?   Mental Status: He is alert.  ?Psychiatric:     ?   Behavior: Behavior is cooperative.  ? ? ? ?UC Treatments / Results  ?Labs ?(all labs ordered are listed, but only abnormal results are displayed) ?Labs Reviewed  ?POCT RAPID STREP A, ED / UC  ? ? ?EKG ? ? ?Radiology ?No results found. ? ?Procedures ?Procedures (including critical care time) ? ?Medications Ordered in UC ?Medications - No data to display ? ?Initial Impression / Assessment and Plan / UC Course  ?I have reviewed the triage vital signs and the nursing  notes. ? ?Pertinent labs & imaging results that were available during my care of the patient were reviewed by me and considered in my medical decision making (see chart for details). ? ?  ? ?Strep testing was negative in clinic today.  Strongly suspect allergies as etiology of symptoms.  Patient was started on Xyzal and Flonase for symptom relief.  We did discuss briefly potential utility of prednisone but he does have a history of diabetes so we will defer steroid use.  Recommended he use Mucinex and gargle with warm salt water for additional symptom relief.  Discussed that if hoarseness and sore throat persist he may benefit from seeing an ENT and was given contact information for local provider with instruction to call to schedule an appointment.  He is scheduled to see his primary care in a few days (04/19/2021) was strongly encouraged to keep this appointment to discuss chronic medical conditions as well as ongoing sore throat.  Discussed that if he develops any worsening symptoms including difficulty speaking, difficulty swallowing, shortness of breath, swelling of his throat, fever, nausea, vomiting he needs to be seen immediately.  Strict return precautions given to which he expressed understanding. ? ?Final Clinical Impressions(s) / UC Diagnoses  ? ?Final diagnoses:  ?Hoarseness of voice  ?Sore throat  ?Environmental allergies  ? ? ? ?Discharge Instructions   ? ?  ?Your strep was negative.  I believe that your symptoms are related to allergies.  Please start levocetirizine at night.  Start Flonase 1 spray in each nostril in the morning.  I think it is reasonable to follow-up with an ENT.  Please call them to schedule an appointment.  If you have any worsening symptoms including difficulty speaking, difficulty swallowing, worsening sore throat, shortness of breath, swelling of your throat, fever you need to be seen immediately. ? ? ? ? ?ED Prescriptions   ? ? Medication Sig Dispense Auth. Provider  ?  levocetirizine (XYZAL ALLERGY 24HR) 5 MG tablet Take 1 tablet (5 mg total) by mouth every evening. 30 tablet Makena Mcgrady K, PA-C  ? fluticasone (FLONASE) 50 MCG/ACT nasal spray Place 1 spray into both nostrils daily. 16 g Nour Rodrigues,

## 2021-04-14 NOTE — ED Triage Notes (Signed)
Pt presents with c/o nasal congestion. States he feels he has mucus on his throat.  ? ? ?Pt states he has tried ginger teas and states he is not able to clear his throat.  ?

## 2021-04-19 ENCOUNTER — Ambulatory Visit (INDEPENDENT_AMBULATORY_CARE_PROVIDER_SITE_OTHER): Payer: 59 | Admitting: Nurse Practitioner

## 2021-04-19 VITALS — BP 141/84 | HR 66 | Temp 97.3°F | Ht 62.0 in | Wt 184.0 lb

## 2021-04-19 DIAGNOSIS — E119 Type 2 diabetes mellitus without complications: Secondary | ICD-10-CM | POA: Diagnosis not present

## 2021-04-19 DIAGNOSIS — Z0000 Encounter for general adult medical examination without abnormal findings: Secondary | ICD-10-CM

## 2021-04-19 DIAGNOSIS — I10 Essential (primary) hypertension: Secondary | ICD-10-CM | POA: Diagnosis not present

## 2021-04-19 LAB — POCT URINALYSIS DIP (CLINITEK)
Bilirubin, UA: NEGATIVE
Blood, UA: NEGATIVE
Glucose, UA: 500 mg/dL — AB
Leukocytes, UA: NEGATIVE
Nitrite, UA: NEGATIVE
POC PROTEIN,UA: NEGATIVE
Spec Grav, UA: 1.025 (ref 1.010–1.025)
Urobilinogen, UA: 0.2 E.U./dL
pH, UA: 6.5 (ref 5.0–8.0)

## 2021-04-19 LAB — POCT GLYCOSYLATED HEMOGLOBIN (HGB A1C)
HbA1c POC (<> result, manual entry): 9.8 % (ref 4.0–5.6)
HbA1c, POC (controlled diabetic range): 9.8 % — AB (ref 0.0–7.0)
HbA1c, POC (prediabetic range): 9.8 % — AB (ref 5.7–6.4)
Hemoglobin A1C: 9.8 % — AB (ref 4.0–5.6)

## 2021-04-19 MED ORDER — LISINOPRIL 40 MG PO TABS: 40.0000 mg | ORAL_TABLET | Freq: Every day | ORAL | 3 refills | Status: DC

## 2021-04-19 MED ORDER — METFORMIN HCL 500 MG PO TABS: 500.0000 mg | ORAL_TABLET | Freq: Three times a day (TID) | ORAL | 2 refills | Status: DC

## 2021-04-19 NOTE — Patient Instructions (Signed)
You were seen today in the [facility] for wellness visit and to establish care. Labs were collected, results will be available via MyChart or, if abnormal, you will be contacted by clinic staff. You were prescribed medications, please take as directed. Please follow up in 3 mths for reevaluation of chronic illness  ?

## 2021-04-19 NOTE — Progress Notes (Signed)
? ?[facility] ?[place], [place]  [address] ?Phone:  [phone]   Fax:  [phone] ?Subjective:  ? Patient ID: Allen Rose, male    DOB: 12-06-1975, 46 y.o.   MRN: [id number] ? ?Chief Complaint  ?Patient presents with  ? Establish Care  ?  Pt is here to establish care. Pt stated he was diagnosed with diabetes and he is concerned   ? ?HPI ?Allen Rose 46 y.o. male with no significant medical history to the [facility] to establish care. States that last visit with PCP 7 mths ago. Due to changes in insurance, patient changed to a provider in network. ? ?Diabetes Mellitus: Patient presents for follow up of diabetes. Symptoms: none. Patient denies any symptoms.  Evaluation to date has been included: hemoglobin A1C.  Home sugars: patient does not check sugars. Treatment to date: no recent interventions.  ? 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 Currently resides with wife and children. Denies any other concerns today. ? ?Denies any fever. Denies any fatigue, chest pain, shortness of breath, HA or dizziness. Denies any blurred vision, numbness or tingling. ? ?No past medical history on file. ? ?No past surgical history on file. ? ?Family History  ?Problem Relation Age of Onset  ? Diabetes Sister   ? ? ?Social History  ? ?Socioeconomic History  ? Marital status: Married  ?  Spouse name: Not on file  ? Number of children: Not on file  ? Years of education: Not on file  ? Highest education level: Not on file  ?Occupational History  ? Not on file  ?Tobacco Use  ? Smoking status: Never  ? Smokeless tobacco: Never  ?Substance and Sexual Activity  ? Alcohol use: Yes  ?  Comment: rarely: 2-4 times per month  ? Drug use: No  ? Sexual activity: Not on file  ?Other Topics Concern  ? Not on file  ?Social History Narrative  ? Not on file  ? ?Social Determinants of Health  ? ?Financial Resource Strain: Not on file  ?Food Insecurity: Not on file   ?Transportation Needs: Not on file  ?Physical Activity: Not on file  ?Stress: Not on file  ?Social Connections: Not on file  ?Intimate Partner Violence: Not on file  ? ? ?Outpatient Medications Prior to Visit  ?Medication Sig Dispense Refill  ? acyclovir (ZOVIRAX) 800 MG tablet Take 800 mg by mouth daily.    ? fluticasone (FLONASE) 50 MCG/ACT nasal spray Place 1 spray into both nostrils daily. 16 g 0  ? ibuprofen (ADVIL) 800 MG tablet Take 1 tablet by mouth every 8 (eight) hours as needed.    ? loratadine (CLARITIN) 10 MG tablet Take 1 tablet by mouth daily.    ? RESTASIS 0.05 % ophthalmic emulsion Place 1 drop into the right eye 2 (two) times daily.    ? metFORMIN (GLUCOPHAGE) 500 MG tablet Take by mouth.    ? atorvastatin (LIPITOR) 40 MG tablet Take 1 tablet by mouth daily. (Patient not taking: Reported on 04/19/2021)    ? levocetirizine (XYZAL ALLERGY 24HR) 5 MG tablet Take 1 tablet (5 mg total) by mouth every evening. (Patient not taking: Reported on 04/19/2021) 30 tablet 0  ? omeprazole (PRILOSEC) 20 MG capsule Take 1 capsule (20 mg total) by mouth daily. 30 capsule 1  ? Pseudoephedrine-Guaifenesin (GUAIFENESIN-PSEUDOEPHEDRINE PO) Take 600 mg by mouth. (Patient not taking: Reported on 04/19/2021)    ? ranitidine (ZANTAC) 150 MG  tablet Take 1 tablet (150 mg total) by mouth 2 (two) times daily. (Patient not taking: Reported on 04/19/2021) 60 tablet 0  ? ?No facility-administered medications prior to visit.  ? ? ?No Known Allergies ? ?Review of Systems  ?Constitutional:  Negative for chills, fever and malaise/fatigue.  ?HENT: Negative.    ?Eyes: Negative.   ?Respiratory:  Negative for cough and shortness of breath.   ?Cardiovascular:  Negative for chest pain, palpitations and leg swelling.  ?Gastrointestinal:  Negative for abdominal pain, blood in stool, constipation, diarrhea, nausea and vomiting.  ?Genitourinary: Negative.   ?Musculoskeletal: Negative.   ?Skin: Negative.   ?Neurological: Negative.    ?Psychiatric/Behavioral:  Negative for depression. The patient is not nervous/anxious.   ?All other systems reviewed and are negative. ? ?   ?Objective:  ?  ?Physical Exam ?Vitals reviewed.  ?Constitutional:   ?   General: He is not in acute distress. ?   Appearance: Normal appearance. He is obese.  ?HENT:  ?   Head: Normocephalic.  ?   Right Ear: Tympanic membrane, ear canal and external ear normal. There is no impacted cerumen.  ?   Left Ear: Tympanic membrane, ear canal and external ear normal. There is no impacted cerumen.  ?   Nose: Nose normal. No congestion or rhinorrhea.  ?   Mouth/Throat:  ?   Mouth: Mucous membranes are moist.  ?   Pharynx: Oropharynx is clear. No oropharyngeal exudate or posterior oropharyngeal erythema.  ?Eyes:  ?   General: No scleral icterus.    ?   Right eye: No discharge.     ?   Left eye: No discharge.  ?   Extraocular Movements: Extraocular movements intact.  ?   Conjunctiva/sclera: Conjunctivae normal.  ?   Pupils: Pupils are equal, round, and reactive to light.  ?Neck:  ?   Vascular: No carotid bruit.  ?Cardiovascular:  ?   Rate and Rhythm: Normal rate and regular rhythm.  ?   Pulses: Normal pulses.  ?   Heart sounds: Normal heart sounds.  ?   Comments: No obvious peripheral edema ?Pulmonary:  ?   Effort: Pulmonary effort is normal.  ?   Breath sounds: Normal breath sounds.  ?Abdominal:  ?   General: Abdomen is flat. Bowel sounds are normal. There is no distension.  ?   Palpations: Abdomen is soft. There is no mass.  ?   Tenderness: There is no abdominal tenderness. There is no right CVA tenderness, left CVA tenderness, guarding or rebound.  ?   Hernia: No hernia is present.  ?Musculoskeletal:     ?   General: No swelling, tenderness, deformity or signs of injury. Normal range of motion.  ?   Cervical back: Normal range of motion and neck supple. No rigidity or tenderness.  ?   Right lower leg: No edema.  ?   Left lower leg: No edema.  ?Lymphadenopathy:  ?   Cervical: No  cervical adenopathy.  ?Skin: ?   General: Skin is warm and dry.  ?   Capillary Refill: Capillary refill takes less than 2 seconds.  ?Neurological:  ?   General: No focal deficit present.  ?   Mental Status: He is alert and oriented to person, place, and time.  ?Psychiatric:     ?   Mood and Affect: Mood normal.     ?   Behavior: Behavior normal.     ?   Thought Content: Thought content normal.     ?  Judgment: Judgment normal.  ? ? ?BP (!) 141/84 (BP Location: Right Arm, Patient Position: Sitting, Cuff Size: Normal)   Pulse 66   Temp (!) 97.3 ?F (36.3 ?C)   Ht '5\' 2"'  (1.575 m)   Wt 184 lb 0.4 oz (83.5 kg)   SpO2 99%   BMI 33.66 kg/m?  ?Wt Readings from Last 3 Encounters:  ?04/19/21 184 lb 0.4 oz (83.5 kg)  ?12/06/17 183 lb (83 kg)  ?04/04/17 189 lb (85.7 kg)  ? ? ?Immunization History  ?Administered Date(s) Administered  ? Influenza,inj,Quad PF,6+ Mos 11/18/2012, 09/29/2014, 01/25/2016, 10/11/2016, 12/06/2017  ? Pneumococcal Polysaccharide-23 09/29/2014  ? Td 03/16/2001  ? Tdap 09/29/2014  ? ? ?Diabetic Foot Exam - Simple   ?No data filed ?  ? ? ?Lab Results  ?Component Value Date  ? TSH 3.863 02/27/2014  ? ?Lab Results  ?Component Value Date  ? WBC 6.2 04/19/2021  ? HGB 15.3 04/19/2021  ? HCT 45.3 04/19/2021  ? MCV 95 04/19/2021  ? PLT 257 04/19/2021  ? ?Lab Results  ?Component Value Date  ? NA 139 04/19/2021  ? K 4.1 04/19/2021  ? CO2 22 04/19/2021  ? GLUCOSE 211 (H) 04/19/2021  ? BUN 15 04/19/2021  ? CREATININE 0.66 (L) 04/19/2021  ? BILITOT 0.3 04/19/2021  ? ALKPHOS 104 04/19/2021  ? AST 27 04/19/2021  ? ALT 48 (H) 04/19/2021  ? PROT 6.9 04/19/2021  ? ALBUMIN 4.1 04/19/2021  ? CALCIUM 9.4 04/19/2021  ? EGFR 119 04/19/2021  ? ?Lab Results  ?Component Value Date  ? CHOL 217 (H) 04/19/2021  ? CHOL 206 (H) 02/27/2014  ? CHOL 191 08/16/2012  ? ?Lab Results  ?Component Value Date  ? HDL 36 (L) 04/19/2021  ? HDL 41 02/27/2014  ? HDL 40 08/16/2012  ? ?Lab Results  ?Component Value Date  ? [place] 85 04/19/2021  ?  LDLCALC 124 (H) 02/27/2014  ? LDLCALC 110 (H) 08/16/2012  ? ?Lab Results  ?Component Value Date  ? TRIG 592 (HH) 04/19/2021  ? TRIG 204 (H) 02/27/2014  ? TRIG 204 (H) 08/16/2012  ? ?Lab Results  ?Component Value Date  ? CHOLHDL 6.0 (H) 04/20/18

## 2021-04-20 ENCOUNTER — Encounter: Payer: Self-pay | Admitting: Nurse Practitioner

## 2021-04-20 LAB — CBC WITH DIFFERENTIAL/PLATELET
Basophils Absolute: 0.1 10*3/uL (ref 0.0–0.2)
Basos: 1 %
EOS (ABSOLUTE): 0.1 10*3/uL (ref 0.0–0.4)
Eos: 2 %
Hematocrit: 45.3 % (ref 37.5–51.0)
Hemoglobin: 15.3 g/dL (ref 13.0–17.7)
Immature Grans (Abs): 0 10*3/uL (ref 0.0–0.1)
Immature Granulocytes: 0 %
Lymphocytes Absolute: 2 10*3/uL (ref 0.7–3.1)
Lymphs: 33 %
MCH: 32.1 pg (ref 26.6–33.0)
MCHC: 33.8 g/dL (ref 31.5–35.7)
MCV: 95 fL (ref 79–97)
Monocytes Absolute: 0.4 10*3/uL (ref 0.1–0.9)
Monocytes: 7 %
Neutrophils Absolute: 3.5 10*3/uL (ref 1.4–7.0)
Neutrophils: 57 %
Platelets: 257 10*3/uL (ref 150–450)
RBC: 4.77 x10E6/uL (ref 4.14–5.80)
RDW: 12.8 % (ref 11.6–15.4)
WBC: 6.2 10*3/uL (ref 3.4–10.8)

## 2021-04-20 LAB — COMPREHENSIVE METABOLIC PANEL
ALT: 48 IU/L — ABNORMAL HIGH (ref 0–44)
AST: 27 IU/L (ref 0–40)
Albumin/Globulin Ratio: 1.5 (ref 1.2–2.2)
Albumin: 4.1 g/dL (ref 4.0–5.0)
Alkaline Phosphatase: 104 IU/L (ref 44–121)
BUN/Creatinine Ratio: 23 — ABNORMAL HIGH (ref 9–20)
BUN: 15 mg/dL (ref 6–24)
Bilirubin Total: 0.3 mg/dL (ref 0.0–1.2)
CO2: 22 mmol/L (ref 20–29)
Calcium: 9.4 mg/dL (ref 8.7–10.2)
Chloride: 102 mmol/L (ref 96–106)
Creatinine, Ser: 0.66 mg/dL — ABNORMAL LOW (ref 0.76–1.27)
Globulin, Total: 2.8 g/dL (ref 1.5–4.5)
Glucose: 211 mg/dL — ABNORMAL HIGH (ref 70–99)
Potassium: 4.1 mmol/L (ref 3.5–5.2)
Sodium: 139 mmol/L (ref 134–144)
Total Protein: 6.9 g/dL (ref 6.0–8.5)
eGFR: 119 mL/min/{1.73_m2} (ref >59)

## 2021-04-20 LAB — LIPID PANEL
Chol/HDL Ratio: 6 ratio — ABNORMAL HIGH (ref 0.0–5.0)
Cholesterol, Total: 217 mg/dL — ABNORMAL HIGH (ref 100–199)
HDL: 36 mg/dL — ABNORMAL LOW (ref >39)
LDL Chol Calc (NIH): 85 mg/dL (ref 0–99)
Triglycerides: 592 mg/dL (ref 0–149)
VLDL Cholesterol Cal: 96 mg/dL — ABNORMAL HIGH (ref 5–40)

## 2021-07-20 ENCOUNTER — Ambulatory Visit (INDEPENDENT_AMBULATORY_CARE_PROVIDER_SITE_OTHER): Payer: 59 | Admitting: Nurse Practitioner

## 2021-07-20 ENCOUNTER — Encounter: Payer: Self-pay | Admitting: Nurse Practitioner

## 2021-07-20 ENCOUNTER — Ambulatory Visit: Payer: BLUE CROSS/BLUE SHIELD | Admitting: Nurse Practitioner

## 2021-07-20 VITALS — BP 118/78 | HR 78 | Temp 98.1°F | Ht 62.0 in | Wt 184.4 lb

## 2021-07-20 DIAGNOSIS — E785 Hyperlipidemia, unspecified: Secondary | ICD-10-CM

## 2021-07-20 DIAGNOSIS — E119 Type 2 diabetes mellitus without complications: Secondary | ICD-10-CM

## 2021-07-20 LAB — POCT GLYCOSYLATED HEMOGLOBIN (HGB A1C)
HbA1c POC (<> result, manual entry): 7.5 % (ref 4.0–5.6)
HbA1c, POC (controlled diabetic range): 7.5 % — AB (ref 0.0–7.0)
HbA1c, POC (prediabetic range): 7.5 % — AB (ref 5.7–6.4)
Hemoglobin A1C: 7.5 % — AB (ref 4.0–5.6)

## 2021-07-20 MED ORDER — ATORVASTATIN CALCIUM 40 MG PO TABS: 40.0000 mg | ORAL_TABLET | Freq: Every day | ORAL | 2 refills | Status: DC

## 2021-07-20 MED ORDER — IBUPROFEN 800 MG PO TABS: 800.0000 mg | ORAL_TABLET | Freq: Three times a day (TID) | ORAL | 0 refills | Status: DC | PRN

## 2021-07-20 MED ORDER — LORATADINE 10 MG PO TABS: 10.0000 mg | ORAL_TABLET | Freq: Every day | ORAL | 2 refills | Status: DC

## 2021-07-20 MED ORDER — METFORMIN HCL 500 MG PO TABS: 500.0000 mg | ORAL_TABLET | Freq: Three times a day (TID) | ORAL | 2 refills | Status: DC

## 2021-07-20 NOTE — Progress Notes (Signed)
@Allen Rose  ID: , male    DOB: [birth year], 46 y.o.   MRN: [id number]  Chief Complaint  Allen Rose presents with   Follow-up    Pt is here for 3 month's DM follow up visit. Pt is requesting a refill loratadine (CLARITIN) 10 MG tablet,metformin, lisinopril, ibuprofen (ADVIL) 800 MG     Referring provider: [id number], DO   HPI  46 year old male with history of type 2 diabetes and GERD.  Allen Rose presents today for a diabetes follow-up.  Allen Rose's cholesterol was elevated at last visit but he is still home cholesterol medication.  We will reorder this for him.  He said he did not get the prescription that was called in for him.  Allen Rose is compliant with diabetic medications and A1c has improved since last visit.  Allen Rose has also been working on diet and exercise.  Denies f/c/s, n/v/d, hemoptysis, PND, leg swelling Denies chest pain or edema        No Known Allergies  Immunization History  Administered Date(s) Administered   Influenza,inj,Quad PF,6+ Mos 11/18/2012, 09/29/2014, 01/25/2016, 10/11/2016, 12/06/2017   Pneumococcal Polysaccharide-23 09/29/2014   Td 03/16/2001   Tdap 09/29/2014    History reviewed. No pertinent past medical history.  Tobacco History: Social History   Tobacco Use  Smoking Status Never  Smokeless Tobacco Never   Counseling given: Not Answered   Outpatient Encounter Medications as of 07/20/2021  Medication Sig   fluticasone (FLONASE) 50 MCG/ACT nasal spray Place 1 spray into both nostrils daily.   glipiZIDE (GLUCOTROL) 10 MG tablet Take 10 mg by mouth in the morning and at bedtime.   lisinopril (ZESTRIL) 40 MG tablet Take 1 tablet (40 mg total) by mouth daily.   ranitidine (ZANTAC) 150 MG tablet Take 1 tablet (150 mg total) by mouth 2 (two) times daily.   RESTASIS 0.05 % ophthalmic emulsion Place 1 drop into the right eye 2 (two) times daily.   [DISCONTINUED] ibuprofen (ADVIL) 800 MG tablet Take 1 tablet by mouth every 8  (eight) hours as needed.   [DISCONTINUED] loratadine (CLARITIN) 10 MG tablet Take 1 tablet by mouth daily.   acyclovir (ZOVIRAX) 800 MG tablet Take 800 mg by mouth daily. (Allen Rose not taking: Reported on 07/20/2021)   atorvastatin (LIPITOR) 40 MG tablet Take 1 tablet (40 mg total) by mouth daily.   ibuprofen (ADVIL) 800 MG tablet Take 1 tablet (800 mg total) by mouth every 8 (eight) hours as needed.   levocetirizine (XYZAL ALLERGY 24HR) 5 MG tablet Take 1 tablet (5 mg total) by mouth every evening. (Allen Rose not taking: Reported on 04/19/2021)   loratadine (CLARITIN) 10 MG tablet Take 1 tablet (10 mg total) by mouth daily.   metFORMIN (GLUCOPHAGE) 500 MG tablet Take 1 tablet (500 mg total) by mouth 3 (three) times daily before meals.   omeprazole (PRILOSEC) 20 MG capsule Take 1 capsule (20 mg total) by mouth daily.   Pseudoephedrine-Guaifenesin (GUAIFENESIN-PSEUDOEPHEDRINE PO) Take 600 mg by mouth. (Allen Rose not taking: Reported on 04/19/2021)   [DISCONTINUED] atorvastatin (LIPITOR) 40 MG tablet Take 1 tablet by mouth daily. (Allen Rose not taking: Reported on 04/19/2021)   [DISCONTINUED] metFORMIN (GLUCOPHAGE) 500 MG tablet Take 1 tablet (500 mg total) by mouth 3 (three) times daily before meals.   No facility-administered encounter medications on file as of 07/20/2021.     Review of Systems  Review of Systems  Constitutional: Negative.   HENT: Negative.    Cardiovascular: Negative.   Gastrointestinal: Negative.   Allergic/Immunologic: Negative.  Neurological: Negative.   Psychiatric/Behavioral: Negative.         Physical Exam  BP 118/78 (BP Location: Right Arm, Allen Rose Position: Sitting, Cuff Size: Large)   Pulse 78   Temp 98.1 F (36.7 C)   Ht 5\' 2"  (1.575 m)   Wt 184 lb 6.4 oz (83.6 kg)   SpO2 97%   BMI 33.73 kg/m   Wt Readings from Last 5 Encounters:  07/20/21 184 lb 6.4 oz (83.6 kg)  04/19/21 184 lb 0.4 oz (83.5 kg)  12/06/17 183 lb (83 kg)  04/04/17 189 lb (85.7 kg)  03/29/17  190 lb (86.2 kg)     Physical Exam Vitals and nursing note reviewed.  Constitutional:      General: He is not in acute distress.    Appearance: He is well-developed.  Cardiovascular:     Rate and Rhythm: Normal rate and regular rhythm.  Pulmonary:     Effort: Pulmonary effort is normal.     Breath sounds: Normal breath sounds.  Skin:    General: Skin is warm and dry.  Neurological:     Mental Status: He is alert and oriented to person, place, and time.      Assessment & Plan:   Type 2 diabetes mellitus - POCT glycosylated hemoglobin (Hb A1C) - CBC - Comprehensive metabolic panel  2. Hyperlipidemia, unspecified hyperlipidemia type  - atorvastatin (LIPITOR) 40 MG tablet; Take 1 tablet (40 mg total) by mouth daily.  Dispense: 30 tablet; Refill: 2   Follow up:  Follow up in 3 months or sooner if needed     03/31/17, NP 07/20/2021

## 2021-07-20 NOTE — Patient Instructions (Addendum)
1. Type 2 diabetes mellitus without complication, without long-term current use of insulin (HCC)  - POCT glycosylated hemoglobin (Hb A1C) - CBC - Comprehensive metabolic panel  2. Hyperlipidemia, unspecified hyperlipidemia type  - atorvastatin (LIPITOR) 40 MG tablet; Take 1 tablet (40 mg total) by mouth daily.  Dispense: 30 tablet; Refill: 2   Follow up:  Follow up in 3 months or sooner if needed

## 2021-07-20 NOTE — Assessment & Plan Note (Signed)
-   POCT glycosylated hemoglobin (Hb A1C) - CBC - Comprehensive metabolic panel  2. Hyperlipidemia, unspecified hyperlipidemia type  - atorvastatin (LIPITOR) 40 MG tablet; Take 1 tablet (40 mg total) by mouth daily.  Dispense: 30 tablet; Refill: 2   Follow up:  Follow up in 3 months or sooner if needed

## 2021-07-21 ENCOUNTER — Other Ambulatory Visit: Payer: Self-pay | Admitting: Nurse Practitioner

## 2021-07-21 DIAGNOSIS — R7989 Other specified abnormal findings of blood chemistry: Secondary | ICD-10-CM

## 2021-07-21 LAB — COMPREHENSIVE METABOLIC PANEL
ALT: 100 IU/L — ABNORMAL HIGH (ref 0–44)
AST: 53 IU/L — ABNORMAL HIGH (ref 0–40)
Albumin/Globulin Ratio: 2 (ref 1.2–2.2)
Albumin: 4.5 g/dL (ref 4.0–5.0)
Alkaline Phosphatase: 93 IU/L (ref 44–121)
BUN/Creatinine Ratio: 23 — ABNORMAL HIGH (ref 9–20)
BUN: 17 mg/dL (ref 6–24)
Bilirubin Total: 0.2 mg/dL (ref 0.0–1.2)
CO2: 22 mmol/L (ref 20–29)
Calcium: 9 mg/dL (ref 8.7–10.2)
Chloride: 106 mmol/L (ref 96–106)
Creatinine, Ser: 0.75 mg/dL — ABNORMAL LOW (ref 0.76–1.27)
Globulin, Total: 2.2 g/dL (ref 1.5–4.5)
Glucose: 111 mg/dL — ABNORMAL HIGH (ref 70–99)
Potassium: 4.1 mmol/L (ref 3.5–5.2)
Sodium: 142 mmol/L (ref 134–144)
Total Protein: 6.7 g/dL (ref 6.0–8.5)
eGFR: 114 mL/min/{1.73_m2} (ref >59)

## 2021-07-21 LAB — CBC
Hematocrit: 41.8 % (ref 37.5–51.0)
Hemoglobin: 14.2 g/dL (ref 13.0–17.7)
MCH: 32.8 pg (ref 26.6–33.0)
MCHC: 34 g/dL (ref 31.5–35.7)
MCV: 97 fL (ref 79–97)
Platelets: 250 10*3/uL (ref 150–450)
RBC: 4.33 x10E6/uL (ref 4.14–5.80)
RDW: 13.6 % (ref 11.6–15.4)
WBC: 6.5 10*3/uL (ref 3.4–10.8)

## 2021-08-11 ENCOUNTER — Telehealth: Payer: Self-pay

## 2021-08-11 NOTE — Telephone Encounter (Signed)
[employer] ID: [id number]  Call attempt made to patient.   Left detailed message per DPR.

## 2021-08-11 NOTE — Telephone Encounter (Signed)
Pt is asking about his lab results if you can give him a call  He speaks SPANISH

## 2021-10-20 ENCOUNTER — Ambulatory Visit: Payer: 59 | Admitting: Nurse Practitioner

## 2021-11-03 ENCOUNTER — Encounter: Payer: Self-pay | Admitting: Nurse Practitioner

## 2021-11-03 ENCOUNTER — Ambulatory Visit (INDEPENDENT_AMBULATORY_CARE_PROVIDER_SITE_OTHER): Payer: Commercial Managed Care - HMO | Admitting: Nurse Practitioner

## 2021-11-03 ENCOUNTER — Telehealth: Payer: Self-pay

## 2021-11-03 VITALS — BP 140/98 | HR 62 | Ht 64.0 in | Wt 178.8 lb

## 2021-11-03 DIAGNOSIS — I10 Essential (primary) hypertension: Secondary | ICD-10-CM

## 2021-11-03 DIAGNOSIS — E785 Hyperlipidemia, unspecified: Secondary | ICD-10-CM

## 2021-11-03 DIAGNOSIS — E119 Type 2 diabetes mellitus without complications: Secondary | ICD-10-CM

## 2021-11-03 LAB — POCT GLYCOSYLATED HEMOGLOBIN (HGB A1C): HbA1c, POC (controlled diabetic range): 10.4 % — AB (ref 0.0–7.0)

## 2021-11-03 LAB — GLUCOSE, POCT (MANUAL RESULT ENTRY): POC Glucose: 266 mg/dl — AB (ref 70–99)

## 2021-11-03 MED ORDER — ATORVASTATIN CALCIUM 40 MG PO TABS: 40.0000 mg | ORAL_TABLET | Freq: Every day | ORAL | 2 refills | Status: DC

## 2021-11-03 MED ORDER — LISINOPRIL 40 MG PO TABS: 40.0000 mg | ORAL_TABLET | Freq: Every day | ORAL | 3 refills | Status: DC

## 2021-11-03 MED ORDER — GLIPIZIDE 10 MG PO TABS: 10.0000 mg | ORAL_TABLET | Freq: Two times a day (BID) | ORAL | 2 refills | Status: DC

## 2021-11-03 MED ORDER — METFORMIN HCL 500 MG PO TABS: 500.0000 mg | ORAL_TABLET | Freq: Three times a day (TID) | ORAL | 2 refills | Status: DC

## 2021-11-03 NOTE — Telephone Encounter (Signed)
Patient had questions about the labs he had back in July. I lmom for patient to return call.

## 2021-11-03 NOTE — Assessment & Plan Note (Signed)
-   POCT glucose (manual entry) - POCT glycosylated hemoglobin (Hb A1C) - metFORMIN (GLUCOPHAGE) 500 MG tablet; Take 1 tablet (500 mg total) by mouth 3 (three) times daily before meals.  Dispense: 90 tablet; Refill: 2 - glipiZIDE (GLUCOTROL) 10 MG tablet; Take 1 tablet (10 mg total) by mouth in the morning and at bedtime.  Dispense: 60 tablet; Refill: 2 - CBC - Comprehensive metabolic panel - AMB Referral to Pharmacy Medication Management   Lab Results  Component Value Date   HGBA1C 10.4 (A) 11/03/2021     2. Hyperlipidemia, unspecified hyperlipidemia type  - atorvastatin (LIPITOR) 40 MG tablet; Take 1 tablet (40 mg total) by mouth daily.  Dispense: 30 tablet; Refill: 2  3. Primary hypertension  - lisinopril (ZESTRIL) 40 MG tablet; Take 1 tablet (40 mg total) by mouth daily.  Dispense: 90 tablet; Refill: 3   Follow up:  Follow up in 3 months

## 2021-11-03 NOTE — Patient Instructions (Addendum)
1. Type 2 diabetes mellitus without complication, without long-term current use of insulin (HCC)  - POCT glucose (manual entry) - POCT glycosylated hemoglobin (Hb A1C) - metFORMIN (GLUCOPHAGE) 500 MG tablet; Take 1 tablet (500 mg total) by mouth 3 (three) times daily before meals.  Dispense: 90 tablet; Refill: 2 - glipiZIDE (GLUCOTROL) 10 MG tablet; Take 1 tablet (10 mg total) by mouth in the morning and at bedtime.  Dispense: 60 tablet; Refill: 2 - CBC - Comprehensive metabolic panel - AMB Referral to Pharmacy Medication Management   Lab Results  Component Value Date   HGBA1C 10.4 (A) 11/03/2021     2. Hyperlipidemia, unspecified hyperlipidemia type  - atorvastatin (LIPITOR) 40 MG tablet; Take 1 tablet (40 mg total) by mouth daily.  Dispense: 30 tablet; Refill: 2  3. Primary hypertension  - lisinopril (ZESTRIL) 40 MG tablet; Take 1 tablet (40 mg total) by mouth daily.  Dispense: 90 tablet; Refill: 3   Follow up:  Follow up in 3 months

## 2021-11-03 NOTE — Progress Notes (Signed)
@Patient  ID: Allen Rose, male    DOB: Feb 07, 1975, 46 y.o.   MRN: [id number]  Chief Complaint  Patient presents with   Diabetes   Medication Refill   Hypertension    Referring provider: Shary Key, DO   HPI  46 year old male with history of type 2 diabetes and GERD.   Patient presents today for a diabetes follow-up. Patient is noncompliant with medications. we discussed his A1C and how he should take his medications. Will have pharmacist follow up with patient. Denies f/c/s, n/v/d, hemoptysis, PND, leg swelling Denies chest pain or edema  Note: Patient was referred to GI for elevated liver function at last visit, but has not got the appointment     No Known Allergies  Immunization History  Administered Date(s) Administered   Influenza,inj,Quad PF,6+ Mos 11/18/2012, 09/29/2014, 01/25/2016, 10/11/2016, 12/06/2017   Pneumococcal Polysaccharide-23 09/29/2014   Td 03/16/2001   Tdap 09/29/2014    History reviewed. No pertinent past medical history.  Tobacco History: Social History   Tobacco Use  Smoking Status Never  Smokeless Tobacco Never   Counseling given: Not Answered   Outpatient Encounter Medications as of 11/03/2021  Medication Sig   acyclovir (ZOVIRAX) 800 MG tablet Take 800 mg by mouth daily. (Patient not taking: Reported on 07/20/2021)   atorvastatin (LIPITOR) 40 MG tablet Take 1 tablet (40 mg total) by mouth daily.   fluticasone (FLONASE) 50 MCG/ACT nasal spray Place 1 spray into both nostrils daily.   glipiZIDE (GLUCOTROL) 10 MG tablet Take 1 tablet (10 mg total) by mouth in the morning and at bedtime.   ibuprofen (ADVIL) 800 MG tablet Take 1 tablet (800 mg total) by mouth every 8 (eight) hours as needed.   levocetirizine (XYZAL ALLERGY 24HR) 5 MG tablet Take 1 tablet (5 mg total) by mouth every evening. (Patient not taking: Reported on 04/19/2021)   lisinopril (ZESTRIL) 40 MG tablet Take 1 tablet (40 mg total) by mouth daily.   loratadine  (CLARITIN) 10 MG tablet Take 1 tablet (10 mg total) by mouth daily.   metFORMIN (GLUCOPHAGE) 500 MG tablet Take 1 tablet (500 mg total) by mouth 3 (three) times daily before meals.   omeprazole (PRILOSEC) 20 MG capsule Take 1 capsule (20 mg total) by mouth daily.   Pseudoephedrine-Guaifenesin (GUAIFENESIN-PSEUDOEPHEDRINE PO) Take 600 mg by mouth. (Patient not taking: Reported on 04/19/2021)   ranitidine (ZANTAC) 150 MG tablet Take 1 tablet (150 mg total) by mouth 2 (two) times daily.   RESTASIS 0.05 % ophthalmic emulsion Place 1 drop into the right eye 2 (two) times daily.   [DISCONTINUED] atorvastatin (LIPITOR) 40 MG tablet Take 1 tablet (40 mg total) by mouth daily.   [DISCONTINUED] glipiZIDE (GLUCOTROL) 10 MG tablet Take 10 mg by mouth in the morning and at bedtime. (Patient not taking: Reported on 11/03/2021)   [DISCONTINUED] lisinopril (ZESTRIL) 40 MG tablet Take 1 tablet (40 mg total) by mouth daily. (Patient not taking: Reported on 11/03/2021)   [DISCONTINUED] metFORMIN (GLUCOPHAGE) 500 MG tablet Take 1 tablet (500 mg total) by mouth 3 (three) times daily before meals.   No facility-administered encounter medications on file as of 11/03/2021.     Review of Systems  Review of Systems  Constitutional: Negative.   HENT: Negative.    Cardiovascular: Negative.   Gastrointestinal: Negative.   Allergic/Immunologic: Negative.   Neurological: Negative.   Psychiatric/Behavioral: Negative.         Physical Exam  BP (!) 140/98   Pulse 62   Ht  5\' 4"  (1.626 m)   Wt 178 lb 12.8 oz (81.1 kg)   SpO2 96%   BMI 30.69 kg/m   Wt Readings from Last 5 Encounters:  11/03/21 178 lb 12.8 oz (81.1 kg)  07/20/21 184 lb 6.4 oz (83.6 kg)  04/19/21 184 lb 0.4 oz (83.5 kg)  12/06/17 183 lb (83 kg)  04/04/17 189 lb (85.7 kg)     Physical Exam Vitals and nursing note reviewed.  Constitutional:      General: He is not in acute distress.    Appearance: He is well-developed.  Cardiovascular:      Rate and Rhythm: Normal rate and regular rhythm.  Pulmonary:     Effort: Pulmonary effort is normal.     Breath sounds: Normal breath sounds.  Skin:    General: Skin is warm and dry.  Neurological:     Mental Status: He is alert and oriented to person, place, and time.     Assessment & Plan:   Type 2 diabetes mellitus - POCT glucose (manual entry) - POCT glycosylated hemoglobin (Hb A1C) - metFORMIN (GLUCOPHAGE) 500 MG tablet; Take 1 tablet (500 mg total) by mouth 3 (three) times daily before meals.  Dispense: 90 tablet; Refill: 2 - glipiZIDE (GLUCOTROL) 10 MG tablet; Take 1 tablet (10 mg total) by mouth in the morning and at bedtime.  Dispense: 60 tablet; Refill: 2 - CBC - Comprehensive metabolic panel - AMB Referral to Pharmacy Medication Management   Lab Results  Component Value Date   HGBA1C 10.4 (A) 11/03/2021     2. Hyperlipidemia, unspecified hyperlipidemia type  - atorvastatin (LIPITOR) 40 MG tablet; Take 1 tablet (40 mg total) by mouth daily.  Dispense: 30 tablet; Refill: 2  3. Primary hypertension  - lisinopril (ZESTRIL) 40 MG tablet; Take 1 tablet (40 mg total) by mouth daily.  Dispense: 90 tablet; Refill: 3   Follow up:  Follow up in 3 months     11/05/2021, NP 11/03/2021

## 2021-11-04 LAB — CBC
Hematocrit: 46.1 % (ref 37.5–51.0)
Hemoglobin: 15.8 g/dL (ref 13.0–17.7)
MCH: 32.8 pg (ref 26.6–33.0)
MCHC: 34.3 g/dL (ref 31.5–35.7)
MCV: 96 fL (ref 79–97)
Platelets: 239 10*3/uL (ref 150–450)
RBC: 4.82 x10E6/uL (ref 4.14–5.80)
RDW: 12.4 % (ref 11.6–15.4)
WBC: 6.1 10*3/uL (ref 3.4–10.8)

## 2021-11-04 LAB — COMPREHENSIVE METABOLIC PANEL
ALT: 102 IU/L — ABNORMAL HIGH (ref 0–44)
AST: 59 IU/L — ABNORMAL HIGH (ref 0–40)
Albumin/Globulin Ratio: 1.9 (ref 1.2–2.2)
Albumin: 4.5 g/dL (ref 4.1–5.1)
Alkaline Phosphatase: 108 IU/L (ref 44–121)
BUN/Creatinine Ratio: 19 (ref 9–20)
BUN: 14 mg/dL (ref 6–24)
Bilirubin Total: 0.5 mg/dL (ref 0.0–1.2)
CO2: 24 mmol/L (ref 20–29)
Calcium: 9.1 mg/dL (ref 8.7–10.2)
Chloride: 102 mmol/L (ref 96–106)
Creatinine, Ser: 0.72 mg/dL — ABNORMAL LOW (ref 0.76–1.27)
Globulin, Total: 2.4 g/dL (ref 1.5–4.5)
Glucose: 249 mg/dL — ABNORMAL HIGH (ref 70–99)
Potassium: 4.3 mmol/L (ref 3.5–5.2)
Sodium: 140 mmol/L (ref 134–144)
Total Protein: 6.9 g/dL (ref 6.0–8.5)
eGFR: 116 mL/min/{1.73_m2} (ref >59)

## 2021-11-04 NOTE — Telephone Encounter (Signed)
Called and left voicemail   Interpreter id: Beverlee Nims [phone]

## 2021-11-04 NOTE — Telephone Encounter (Signed)
Patient does speak english

## 2021-11-09 ENCOUNTER — Telehealth: Payer: Self-pay

## 2021-11-09 NOTE — Chronic Care Management (AMB) (Signed)
   Care Guide Note  11/09/2021 Name: Allen Rose MRN: [id number] DOB: [birth year]  Referred by: Shary Key, DO Reason for referral : Care Coordination (Outreach to schedule with Pharm D Catie )   Allen Rose is a 46 y.o. year old male who is a primary care patient of [place], [place] was referred to the pharmacist for assistance related to DM.    An unsuccessful telephone outreach was attempted today to contact the patient who was referred to the pharmacy team for assistance with medication management. Additional attempts will be made to contact the patient.   Noreene Larsson, [place], [place] [address] Direct Dial: [phone] [name]

## 2021-11-14 NOTE — Progress Notes (Signed)
Error

## 2021-11-15 NOTE — Chronic Care Management (AMB) (Signed)
   Care Guide Note  11/15/2021 Name: Refujio Haymer MRN: [id number] DOB: 30-Jan-1975  Referred by: Shary Key, DO Reason for referral : Care Coordination (Outreach to schedule with Pharm D Catie )   Kuzey Ogata is a 46 y.o. year old male who is a primary care patient of [place], [place] was referred to the pharmacist for assistance related to DM.    A second unsuccessful telephone outreach was attempted today to contact the patient who was referred to the pharmacy team for assistance with medication management. Additional attempts will be made to contact the patient.  Noreene Larsson, [place], [place] [address] Direct Dial: [phone] [email or web address]

## 2021-11-21 NOTE — Progress Notes (Signed)
   Care Guide Note  11/21/2021 Name: Allen Rose MRN: [id number] DOB: March 24, 1975  Referred by: Shary Key, DO Reason for referral : Care Coordination (Outreach to schedule with Pharm D Catie )   Allen Rose is a 46 y.o. year old male who is a primary care patient of [place], [place] was referred to the pharmacist for assistance related to DM.    A third unsuccessful telephone outreach was attempted today to contact the patient who was referred to the pharmacy team for assistance with medication management. The Population Health team is pleased to engage with this patient at any time in the future upon receipt of referral and should he/she be interested in assistance from the [place] team.   Noreene Larsson, [place], [place] [address] Direct Dial: [phone] [email or web address]

## 2021-12-15 ENCOUNTER — Other Ambulatory Visit: Payer: Self-pay

## 2021-12-15 NOTE — Telephone Encounter (Signed)
Pt was called to find out if he is using [facility] . K[facility]

## 2021-12-21 ENCOUNTER — Other Ambulatory Visit (INDEPENDENT_AMBULATORY_CARE_PROVIDER_SITE_OTHER): Payer: Commercial Managed Care - HMO

## 2021-12-21 ENCOUNTER — Ambulatory Visit: Payer: Commercial Managed Care - HMO | Admitting: Internal Medicine

## 2021-12-21 ENCOUNTER — Encounter: Payer: Self-pay | Admitting: Internal Medicine

## 2021-12-21 VITALS — BP 134/64 | HR 78 | Ht 64.0 in | Wt 182.2 lb

## 2021-12-21 DIAGNOSIS — E669 Obesity, unspecified: Secondary | ICD-10-CM

## 2021-12-21 DIAGNOSIS — E66811 Obesity, class 1: Secondary | ICD-10-CM

## 2021-12-21 DIAGNOSIS — E8881 Metabolic syndrome: Secondary | ICD-10-CM | POA: Diagnosis not present

## 2021-12-21 DIAGNOSIS — R7989 Other specified abnormal findings of blood chemistry: Secondary | ICD-10-CM | POA: Diagnosis not present

## 2021-12-21 LAB — IBC PANEL
Iron: 147 ug/dL (ref 42–165)
Saturation Ratios: 34.3 % (ref 20.0–50.0)
TIBC: 428.4 ug/dL (ref 250.0–450.0)
Transferrin: 306 mg/dL (ref 212.0–360.0)

## 2021-12-21 LAB — PROTIME-INR
INR: 1.2 ratio — ABNORMAL HIGH (ref 0.8–1.0)
Prothrombin Time: 13.1 s (ref 9.6–13.1)

## 2021-12-21 LAB — FERRITIN: Ferritin: 113.2 ng/mL (ref 22.0–322.0)

## 2021-12-21 LAB — IRON: Iron: 147 ug/dL (ref 42–165)

## 2021-12-21 NOTE — Progress Notes (Signed)
HISTORY OF PRESENT ILLNESS:  Allen Rose is a 46 y.o. male, native of [place] who has been residing in [place] for 25 years and currently works as a Investment banker, operational at [employer].  He is a married father of 3 children.Marland Kitchen  He is sent today by his primary care provider regarding elevated hepatic transaminases.  The patient is accompanied by a Adult nurse.  The patient denies a family or personal history of liver disease.  He denies a personal history of hepatitis.  Review of outside blood work over the past 8 months reveals elevated hepatic transaminases with ALT greater than AST.  Most recent liver tests ALT 100, AST 59, alkaline phosphatase 108, total bilirubin 0.5.  Normal albumin and globulins.  Normal CBC with hemoglobin 15.8.  Normal platelet count at 239.  Normal MCV at 96.  He does have diabetes and elevated hemoglobin A1c of 10.4 most recently.  He uses alcohol in moderation on weekends and holidays.  Amongst his medications are Lipitor, ibuprofen, lisinopril, Glucophage, and omeprazole.  GI review of systems is otherwise negative  REVIEW OF SYSTEMS:  All non-GI ROS negative unless otherwise stated in the HPI.  History reviewed. No pertinent past medical history.  History reviewed. No pertinent surgical history.  Social History [name]  reports that he has never smoked. He has never used smokeless tobacco. He reports current alcohol use. He reports that he does not use drugs.  family history includes Diabetes in his sister.  No Known Allergies     PHYSICAL EXAMINATION: Vital signs: BP 134/64   Pulse 78   Ht 5\' 4"  (1.626 m)   Wt 182 lb 4 oz (82.7 kg)   BMI 31.28 kg/m   Constitutional: generally well-appearing, no acute distress Psychiatric: alert and oriented x 3, cooperative Eyes: extraocular movements intact, anicteric, conjunctiva pink Mouth: oral pharynx moist, no lesions Neck: supple no lymphadenopathy Cardiovascular: heart regular rate  and rhythm, no murmur Lungs: clear to auscultation bilaterally Abdomen: soft, nontender, nondistended, no obvious ascites, no peritoneal signs, normal bowel sounds, no organomegaly Rectal: Omitted Extremities: no clubbing, cyanosis, or lower extremity edema bilaterally Skin: no lesions on visible extremities Neuro: No focal deficits. No asterixis.    ASSESSMENT:  1.  Chronic elevation of transaminases.  Most likely fatty liver disease.  Rule out other causes.  No evidence for hepatic synthetic dysfunction or clinically relevant liver disease 2.  Obesity with BMI 31.3 3.  Metabolic syndrome with the addition of hypertension and diabetes as well as dyslipidemia   PLAN:  1.  Multiple serologies and supplemental blood work evaluating for viral and nonviral causes for chronic elevation of liver test. 2.  PT/INR to assess for synthetic liver function 3.  Abdominal ultrasound to evaluate elevated liver test 4.  Office follow-up in 1 month to review the above 5.  If work-up unrevealing, recommend exercise weight loss as well as good control of diabetes for fatty liver disease 6.  Ongoing general medical follow-up with his PCP A total time of 45 minutes was spent preparing to see the patient, reviewing outside data, obtaining comprehensive history, performing medically appropriate physical examination, counseling and educating the patient with regards to the above issues with the assistance of the translator, ordering multiple laboratory studies, ordering advanced radiology study, arranging follow-up, and documenting clinical information in the health record.

## 2021-12-21 NOTE — Patient Instructions (Addendum)
_______________________________________________________  If you are age 45 or older, your body mass index should be between 23-30. Your Body mass index is 31.28 kg/m. If this is out of the aforementioned range listed, please consider follow up with your Primary Care Provider.  If you are age 30 or younger, your body mass index should be between 19-25. Your Body mass index is 31.28 kg/m. If this is out of the aformentioned range listed, please consider follow up with your Primary Care Provider.   ________________________________________________________  The [facility] providers would like to encourage you to use [facility] to communicate with providers for non-urgent requests or questions.  Due to long hold times on the telephone, sending your provider a message by [facility] may be a faster and more efficient way to get a response.  Please allow 48 business hours for a response.  Please remember that this is for non-urgent requests.  _______________________________________________________   Su proveedor le ha solicitado que vaya al stano para Education officer, environmental anlisis de laboratorio antes de irse hoy. Presione "B" en el ascensor. El laboratorio est ubicado en la primera puerta a la izquierda al salir del Medical sales representative.  Se le ha programado una ecografa abdominal en [name] (primer piso del hospital) el 14/12/2021 a las 9:00 a. m. Llegue 15 minutos antes de su cita para registrarse. Asegrese de no comer ni beber nada 6 horas antes de su cita. Si necesita reprogramar su cita, comunquese con radiologa al [phone]. Esta prueba suele tardar unos 30 minutos en realizarse.

## 2021-12-25 LAB — ALPHA-1-ANTITRYPSIN: A-1 Antitrypsin, Ser: 101 mg/dL (ref 83–199)

## 2021-12-25 LAB — HEPATITIS B SURFACE ANTIBODY,QUALITATIVE: Hep B S Ab: NONREACTIVE

## 2021-12-25 LAB — MITOCHONDRIAL ANTIBODIES: Mitochondrial M2 Ab, IgG: <=20 U (ref <=20.0)

## 2021-12-25 LAB — ANA: Anti Nuclear Antibody (ANA): NEGATIVE

## 2021-12-25 LAB — HEPATITIS C ANTIBODY: Hepatitis C Ab: NONREACTIVE

## 2021-12-25 LAB — CERULOPLASMIN: Ceruloplasmin: 22 mg/dL (ref 18–36)

## 2021-12-25 LAB — ANTI-SMOOTH MUSCLE ANTIBODY, IGG: Actin (Smooth Muscle) Antibody (IGG): <20 U (ref <20)

## 2021-12-25 LAB — HEPATITIS B SURFACE ANTIGEN: Hepatitis B Surface Ag: NONREACTIVE

## 2021-12-29 ENCOUNTER — Ambulatory Visit (HOSPITAL_COMMUNITY)
Admission: RE | Admit: 2021-12-29 | Discharge: 2021-12-29 | Disposition: A | Discharge status: Home / Self Care | Payer: Commercial Managed Care - HMO | Source: Ambulatory Visit | Attending: Internal Medicine | Admitting: Internal Medicine

## 2021-12-29 DIAGNOSIS — R7989 Other specified abnormal findings of blood chemistry: Secondary | ICD-10-CM | POA: Insufficient documentation

## 2022-01-10 ENCOUNTER — Other Ambulatory Visit: Payer: Self-pay

## 2022-01-10 ENCOUNTER — Telehealth: Payer: Self-pay

## 2022-01-10 DIAGNOSIS — E119 Type 2 diabetes mellitus without complications: Secondary | ICD-10-CM

## 2022-01-10 DIAGNOSIS — I10 Essential (primary) hypertension: Secondary | ICD-10-CM

## 2022-01-10 DIAGNOSIS — E785 Hyperlipidemia, unspecified: Secondary | ICD-10-CM

## 2022-01-10 MED ORDER — LISINOPRIL 40 MG PO TABS: 40.0000 mg | ORAL_TABLET | Freq: Every day | ORAL | 1 refills | Status: DC

## 2022-01-10 MED ORDER — ATORVASTATIN CALCIUM 40 MG PO TABS: 40.0000 mg | ORAL_TABLET | Freq: Every day | ORAL | 1 refills | Status: DC

## 2022-01-10 MED ORDER — GLIPIZIDE 10 MG PO TABS: 10.0000 mg | ORAL_TABLET | Freq: Two times a day (BID) | ORAL | 1 refills | Status: DC

## 2022-01-10 MED ORDER — METFORMIN HCL 500 MG PO TABS: 500.0000 mg | ORAL_TABLET | Freq: Three times a day (TID) | ORAL | 1 refills | Status: DC

## 2022-01-10 NOTE — Telephone Encounter (Signed)
Error

## 2022-02-03 ENCOUNTER — Ambulatory Visit: Payer: Self-pay | Admitting: Nurse Practitioner

## 2022-02-09 ENCOUNTER — Ambulatory Visit: Payer: Commercial Managed Care - HMO | Admitting: Internal Medicine

## 2022-02-10 ENCOUNTER — Ambulatory Visit: Payer: Self-pay | Admitting: Nurse Practitioner

## 2022-02-10 NOTE — Progress Notes (Deleted)
  Subjective:    Patient ID: Allen Rose, male    DOB: 1975/11/06, 47 y.o.   MRN: [id number]  Gerrald Basu is a 47 y.o. male who presents for follow-up of Type 2 diabetes mellitus.  Patient {is/are not:32546} checking home blood sugars.   Home blood sugar records: {dm home sugars:14018} How often is blood sugars being checked: *** Current symptoms/problems include {Symptoms; diabetes:14075} and have been {Desc; course:15616}. Daily foot checks: ***   Any foot concerns: *** Last eye exam: *** Exercise: {types:19826}  The following portions of the patient's history were reviewed and updated as appropriate: allergies, current medications, past medical history, past social history and problem list.  ROS as in subjective above.     Objective:    Physical Exam Alert and in no distress otherwise not examined.  There were no vitals taken for this visit.  Lab Review    Latest Ref Rng & Units 11/03/2021    8:51 AM 11/03/2021    8:31 AM 07/20/2021    2:34 PM 07/20/2021    2:21 PM 04/19/2021    2:04 PM  Diabetic Labs  HbA1c 0.0 - 7.0 %  10.4   7.5    7.5    7.5    7.5    Chol 100 - 199 mg/dL     217   HDL >39 mg/dL     36   Calc LDL 0 - 99 mg/dL     85   Triglycerides 0 - 149 mg/dL     592   Creatinine 0.76 - 1.27 mg/dL 0.72   0.75   0.66       12/21/2021   10:49 AM 11/03/2021    8:10 AM 07/20/2021    1:59 PM 04/19/2021    1:34 PM 04/14/2021    4:42 PM  BP/Weight  Systolic BP 062 694 854 627 035  Diastolic BP 64 98 78 84 92  Wt. (Lbs) 182.25 178.8 184.4 184.03   BMI 31.28 kg/m2 30.69 kg/m2 33.73 kg/m2 33.66 kg/m2       Latest Ref Rng & Units 04/25/2017   12:00 AM  Foot/eye exam completion dates  Eye Exam No Retinopathy No Retinopathy         This result is from an external source.    Dinari  reports that he has never smoked. He has never used smokeless tobacco. He reports current alcohol use. He reports that he does not use drugs.     Assessment & Plan:    No  diagnosis found.  Rx changes: {none:33079} Education: Reviewed 'ABCs' of diabetes management (respective goals in parentheses):  A1C (<7), blood pressure (<130/80), and cholesterol (LDL <100). Compliance at present is estimated to be {good/fair/poor:33178}. Efforts to improve compliance (if necessary) will be directed at {compliance:16716}. Follow up: {NUMBERS; 0-10:33138} {time:11}

## 2022-02-16 ENCOUNTER — Ambulatory Visit: Payer: Self-pay | Admitting: Nurse Practitioner

## 2022-07-17 ENCOUNTER — Encounter (HOSPITAL_COMMUNITY): Payer: Self-pay

## 2022-07-17 ENCOUNTER — Ambulatory Visit (HOSPITAL_COMMUNITY)
Admission: EM | Admit: 2022-07-17 | Discharge: 2022-07-17 | Disposition: A | Discharge status: Home / Self Care | Payer: BLUE CROSS/BLUE SHIELD | Attending: Internal Medicine | Admitting: Internal Medicine

## 2022-07-17 DIAGNOSIS — E119 Type 2 diabetes mellitus without complications: Secondary | ICD-10-CM | POA: Insufficient documentation

## 2022-07-17 DIAGNOSIS — N949 Unspecified condition associated with female genital organs and menstrual cycle: Secondary | ICD-10-CM | POA: Diagnosis present

## 2022-07-17 DIAGNOSIS — L293 Anogenital pruritus, unspecified: Secondary | ICD-10-CM | POA: Diagnosis not present

## 2022-07-17 DIAGNOSIS — S3120XA Unspecified open wound of penis, initial encounter: Secondary | ICD-10-CM | POA: Diagnosis not present

## 2022-07-17 DIAGNOSIS — Z7984 Long term (current) use of oral hypoglycemic drugs: Secondary | ICD-10-CM | POA: Insufficient documentation

## 2022-07-17 DIAGNOSIS — K219 Gastro-esophageal reflux disease without esophagitis: Secondary | ICD-10-CM | POA: Insufficient documentation

## 2022-07-17 LAB — HIV ANTIBODY (ROUTINE TESTING W REFLEX): HIV Screen 4th Generation wRfx: NONREACTIVE

## 2022-07-17 MED ORDER — NYSTATIN-TRIAMCINOLONE 100000-0.1 UNIT/GM-% EX CREA: TOPICAL_CREAM | CUTANEOUS | 0 refills | Status: DC

## 2022-07-17 NOTE — ED Provider Notes (Signed)
[facility]    CSN: [id number] Arrival date & time: 07/17/22  1503      History   Chief Complaint No chief complaint on file.   HPI Allen Rose is a 47 y.o. male who presents with foreskin itching and fissures x 2 weeks. States he applied hydrocortisone cream and helped the swelling some, but the fissures and itching have not resolved.He had sex with a married women once 2 months ago and did not wear a condom. His wife had similar symptoms, but her symptoms have resolved. He recalls once day going to void after working outside and not washing his hands and thought maybe it was from that. He does admit of having history of being a diabetic, but has changed his diet, and lost wt. And is not taking his medications. Has not had his HGBA1C checked in 2 years, but plans to see his PCP soon.     History reviewed. No pertinent past medical history.  Patient Active Problem List   Diagnosis Date Noted   Persistent hoarseness 04/04/2017   Viral URI with cough 02/14/2017   Trapezius muscle spasm 01/26/2016   Type 2 diabetes mellitus (HCC) 10/01/2014   Metabolic syndrome 04/27/2014   Nonspecific elevation of level of transaminase or lactic acid dehydrogenase (LDH) 03/04/2014   Blurred vision, right eye 02/27/2014   Thyroiditis, acute nonsuppurative 07/14/2013   Dysuria 07/11/2013   Acanthosis nigricans 07/11/2013   Dental caries 02/01/2013   Conjunctivitis unspecified 08/18/2012   GERD 08/05/2007   FATIGUE 08/05/2007   Common wart 07/15/2007   VENEREAL WART 07/15/2007   EXOGENOUS OBESITY 07/15/2007   DEPRESSIVE DISORDER NOT ELSEWHERE CLASSIFIED 07/15/2007    History reviewed. No pertinent surgical history.     Home Medications    Prior to Admission medications   Medication Sig Start Date End Date Taking? Authorizing Provider  acyclovir (ZOVIRAX) 800 MG tablet Take 800 mg by mouth daily. 11/05/20  Yes [provider]  Blood Glucose Monitoring Suppl  (GHT BLOOD GLUCOSE MONITOR) w/Device KIT See admin instructions. 05/27/20  Yes [provider]  fluticasone (FLONASE) 50 MCG/ACT nasal spray Place 1 spray into both nostrils daily. 04/14/21  Yes Raspet, Erin K, PA-C  glipiZIDE (GLUCOTROL) 10 MG tablet Take 1 tablet (10 mg total) by mouth in the morning and at bedtime. 01/10/22  Yes Ivonne Andrew, NP  levocetirizine (XYZAL ALLERGY 24HR) 5 MG tablet Take 1 tablet (5 mg total) by mouth every evening. 04/14/21  Yes Raspet, Erin K, PA-C  loratadine (CLARITIN) 10 MG tablet Take by mouth. 05/25/20  Yes [provider]  metFORMIN (GLUCOPHAGE) 500 MG tablet Take 1 tablet (500 mg total) by mouth 3 (three) times daily before meals. 01/10/22  Yes Ivonne Andrew, NP  nystatin-triamcinolone [facility] II) cream Apply to affected area bid x 7-10 days 07/17/22  Yes Rodriguez-Southworth, Nettie Elm, PA-C  ranitidine (ZANTAC) 150 MG tablet Take 1 tablet (150 mg total) by mouth 2 (two) times daily. 03/30/17  Yes Domenick Gong, MD  RESTASIS 0.05 % ophthalmic emulsion Place 1 drop into the right eye 2 (two) times daily. 11/08/20  Yes [provider]  atorvastatin (LIPITOR) 40 MG tablet Take 1 tablet (40 mg total) by mouth daily. 01/10/22   Ivonne Andrew, NP  ibuprofen (ADVIL) 800 MG tablet Take 1 tablet (800 mg total) by mouth every 8 (eight) hours as needed. 07/20/21   Ivonne Andrew, NP  lisinopril (ZESTRIL) 40 MG tablet Take 1 tablet (40 mg total) by  mouth daily. 01/10/22   Ivonne Andrew, NP  loratadine (CLARITIN) 10 MG tablet Take 1 tablet (10 mg total) by mouth daily. 07/20/21 10/18/21  Ivonne Andrew, NP  omeprazole (PRILOSEC) 20 MG capsule Take 1 capsule (20 mg total) by mouth daily. 03/29/17 04/28/17  Leath-Warren, Sadie Haber, NP    Family History Family History  Problem Relation Age of Onset   Diabetes Sister     Social History Social History   Tobacco Use   Smoking status: Never   Smokeless tobacco: Never  Substance Use  Topics   Alcohol use: Yes    Comment: rarely: 2-4 times per month   Drug use: No     Allergies   Patient has no known allergies.   Review of Systems Review of Systems  Endocrine: Negative for polydipsia, polyphagia and polyuria.       Hx of DM, but does not check his glucose  Genitourinary:  Negative for dysuria, frequency, penile discharge, penile pain and urgency.  Skin:  Positive for rash and wound.     Physical Exam Triage Vital Signs ED Triage Vitals  Enc Vitals Group     BP 07/17/22 1558 (!) 163/96     Pulse Rate 07/17/22 1558 67     Resp 07/17/22 1558 18     Temp 07/17/22 1558 (!) 97.4 F (36.3 C)     Temp Source 07/17/22 1558 Oral     SpO2 07/17/22 1558 97 %     Weight 07/17/22 1627 170 lb 6.4 oz (77.3 kg)     Height --      Head Circumference --      Peak Flow --      Pain Score --      Pain Loc --      Pain Edu? --      Excl. in GC? --    No data found.  Updated Vital Signs BP (!) 163/96 (BP Location: Left Arm)   Pulse 67   Temp (!) 97.4 F (36.3 C) (Oral)   Resp 18   Wt 170 lb 6.4 oz (77.3 kg)   SpO2 97%   BMI 29.25 kg/m   Visual Acuity Right Eye Distance:   Left Eye Distance:   Bilateral Distance:    Right Eye Near:   Left Eye Near:    Bilateral Near:     Physical Exam Vitals and nursing note reviewed.  Constitutional:      General: He is not in acute distress.    Appearance: He is not toxic-appearing.  HENT:     Right Ear: External ear normal.     Left Ear: External ear normal.  Eyes:     General: No scleral icterus.    Conjunctiva/sclera: Conjunctivae normal.  Pulmonary:     Effort: Pulmonary effort is normal.  Genitourinary:    Comments: Foreskin looks dry and has several fissures which are not painful, and few satelite lesions. No vesicles or large wounds noted.  Musculoskeletal:     Cervical back: Neck supple.  Skin:    General: Skin is warm and dry.  Neurological:     Mental Status: He is alert and oriented to person,  place, and time.     Gait: Gait normal.  Psychiatric:        Mood and Affect: Mood normal.        Behavior: Behavior normal.        Thought Content: Thought content normal.  Judgment: Judgment normal.      UC Treatments / Results  Labs (all labs ordered are listed, but only abnormal results are displayed) Labs Reviewed  RPR  HIV ANTIBODY (ROUTINE TESTING W REFLEX)    EKG   Radiology No results found.  Procedures Procedures (including critical care time)  Medications Ordered in UC Medications - No data to display  Initial Impression / Assessment and Plan / UC Course  I have reviewed the triage vital signs and the nursing notes.  I did herpes swab and ordered RPR and HIV test. We will call him when the results are back. In the mean time I placed him on Nystatin cream as noted. Advised strongly to FU with his PCP to have his DM checked.     Final Clinical Impressions(s) / UC Diagnoses   Final diagnoses:  Itching of male genitalia  Genital lesion, male   Discharge Instructions   None    ED Prescriptions     Medication Sig Dispense Auth. Provider   nystatin-triamcinolone (MYCOLOG II) cream Apply to affected area bid x 7-10 days 60 g Rodriguez-Southworth, Nettie Elm, PA-C      PDMP not reviewed this encounter.   Garey Ham, [place] 07/17/22 1636

## 2022-07-17 NOTE — ED Triage Notes (Signed)
Denies any exposure to STD.

## 2022-07-17 NOTE — ED Triage Notes (Signed)
Pt reports his foreskin on his penis has been itching and burning x 1 week. Pt reports the area is red and swollen.

## 2022-07-18 ENCOUNTER — Telehealth (HOSPITAL_COMMUNITY): Payer: Self-pay | Admitting: Internal Medicine

## 2022-07-18 LAB — RPR: RPR Ser Ql: NONREACTIVE

## 2022-07-18 NOTE — Telephone Encounter (Signed)
I informed him that his RPR and HIV test are negative

## 2022-07-20 LAB — HSV CULTURE AND TYPING

## 2023-02-02 ENCOUNTER — Encounter (HOSPITAL_COMMUNITY): Payer: Self-pay | Admitting: *Deleted

## 2023-02-02 ENCOUNTER — Other Ambulatory Visit: Payer: Self-pay

## 2023-02-02 ENCOUNTER — Ambulatory Visit (HOSPITAL_COMMUNITY)
Admission: EM | Admit: 2023-02-02 | Discharge: 2023-02-02 | Disposition: A | Discharge status: Home / Self Care | Payer: No Typology Code available for payment source | Attending: Family Medicine | Admitting: Family Medicine

## 2023-02-02 DIAGNOSIS — E119 Type 2 diabetes mellitus without complications: Secondary | ICD-10-CM | POA: Insufficient documentation

## 2023-02-02 DIAGNOSIS — H66003 Acute suppurative otitis media without spontaneous rupture of ear drum, bilateral: Secondary | ICD-10-CM | POA: Diagnosis present

## 2023-02-02 DIAGNOSIS — Z760 Encounter for issue of repeat prescription: Secondary | ICD-10-CM | POA: Insufficient documentation

## 2023-02-02 DIAGNOSIS — R739 Hyperglycemia, unspecified: Secondary | ICD-10-CM | POA: Insufficient documentation

## 2023-02-02 LAB — BASIC METABOLIC PANEL
Anion gap: 12 (ref 5–15)
BUN: 14 mg/dL (ref 6–20)
CO2: 23 mmol/L (ref 22–32)
Calcium: 9.3 mg/dL (ref 8.9–10.3)
Chloride: 100 mmol/L (ref 98–111)
Creatinine, Ser: 0.7 mg/dL (ref 0.61–1.24)
GFR, Estimated: >60 mL/min (ref >60)
Glucose, Bld: 401 mg/dL — ABNORMAL HIGH (ref 70–99)
Potassium: 3.9 mmol/L (ref 3.5–5.1)
Sodium: 135 mmol/L (ref 135–145)

## 2023-02-02 LAB — HEMOGLOBIN A1C
Hgb A1c MFr Bld: 11.6 % — ABNORMAL HIGH (ref 4.8–5.6)
Mean Plasma Glucose: 286.22 mg/dL

## 2023-02-02 LAB — POCT FASTING CBG KUC MANUAL ENTRY: POCT Glucose (KUC): 419 mg/dL — AB (ref 70–99)

## 2023-02-02 MED ORDER — METFORMIN HCL 500 MG PO TABS
500.0000 mg | ORAL_TABLET | Freq: Three times a day (TID) | ORAL | 0 refills | Status: DC
Start: 2023-02-02 — End: 2023-03-23

## 2023-02-02 MED ORDER — GLIPIZIDE 10 MG PO TABS
10.0000 mg | ORAL_TABLET | Freq: Two times a day (BID) | ORAL | 0 refills | Status: DC
Start: 2023-02-02 — End: 2023-03-23

## 2023-02-02 MED ORDER — AMOXICILLIN-POT CLAVULANATE 875-125 MG PO TABS: 1.0000 | ORAL_TABLET | Freq: Two times a day (BID) | ORAL | 0 refills | Status: DC

## 2023-02-02 NOTE — ED Triage Notes (Signed)
Sore throat  for 10 days. PT is non compliant with diabetic treatment. Pt stopped going to Doctor and takes his Meds not every day. Pt wants a refill on his diabetic meds but has stopped going to his Doctor.

## 2023-02-02 NOTE — Discharge Instructions (Signed)
I have prescribed Augmentin for ear infection, you will take this twice daily for 7 days.  I have refilled your prescriptions for 30 days for metformin and glipizide.  Your blood work results will come back over the next few days and someone will call if results are abnormal.  Please follow-up with your primary care doctor for further management of your diabetes.  Return here as needed.  Le recet Augmentin para la infeccin de odo; deber [employer] al da durante 7 [place].  He resurtido sus recetas por [address] de metformina y [place].  Los resultados de sus anlisis de sangre estarn disponibles en los prximos das y alguien lo llamar si los resultados son anormales.  Haga un seguimiento con su mdico de atencin primaria para un mayor control de su diabetes.  Regrese aqu segn sea necesario.

## 2023-02-02 NOTE — ED Notes (Signed)
AT time of DC pt reported he spoke [place] and did not need the interpreter.

## 2023-02-03 NOTE — ED Provider Notes (Signed)
[facility]    CSN: [id number] Arrival date & time: 02/02/23  1516      History   Chief Complaint Chief Complaint  Patient presents with   Sore Throat    HPI Allen Rose is a 48 y.o. male.   Patient presents with sore throat, congestion, and cough x 10 days.  Denies shortness of breath, fever, and chest pain.  Patient also states that he he has run out of his diabetes medication and states that he is controlling his diabetes with exercise.   Sore Throat Pertinent negatives include no chest pain and no shortness of breath.    History reviewed. No pertinent past medical history.  Patient Active Problem List   Diagnosis Date Noted   Persistent hoarseness 04/04/2017   Viral URI with cough 02/14/2017   Trapezius muscle spasm 01/26/2016   Type 2 diabetes mellitus (HCC) 10/01/2014   Metabolic syndrome 04/27/2014   Nonspecific elevation of level of transaminase or lactic acid dehydrogenase (LDH) 03/04/2014   Blurred vision, right eye 02/27/2014   Thyroiditis, acute nonsuppurative 07/14/2013   Dysuria 07/11/2013   Acanthosis nigricans 07/11/2013   Dental caries 02/01/2013   Conjunctivitis 08/18/2012   GERD 08/05/2007   FATIGUE 08/05/2007   Common wart 07/15/2007   VENEREAL WART 07/15/2007   EXOGENOUS OBESITY 07/15/2007   DEPRESSIVE DISORDER NOT ELSEWHERE CLASSIFIED 07/15/2007    History reviewed. No pertinent surgical history.     Home Medications    Prior to Admission medications   Medication Sig Start Date End Date Taking? Authorizing Provider  acyclovir (ZOVIRAX) 800 MG tablet Take 800 mg by mouth daily. 11/05/20  Yes [provider]  amoxicillin-clavulanate (AUGMENTIN) 875-125 MG tablet Take 1 tablet by mouth every 12 (twelve) hours. 02/02/23  Yes Susann Givens, Kamilla Hands A, NP  atorvastatin (LIPITOR) 40 MG tablet Take 1 tablet (40 mg total) by mouth daily. 01/10/22  Yes Ivonne Andrew, NP  Blood Glucose Monitoring Suppl (GHT BLOOD GLUCOSE  MONITOR) w/Device KIT See admin instructions. 05/27/20  Yes [provider]  fluticasone (FLONASE) 50 MCG/ACT nasal spray Place 1 spray into both nostrils daily. 04/14/21  Yes Raspet, Erin K, PA-C  ibuprofen (ADVIL) 800 MG tablet Take 1 tablet (800 mg total) by mouth every 8 (eight) hours as needed. 07/20/21  Yes Ivonne Andrew, NP  levocetirizine (XYZAL ALLERGY 24HR) 5 MG tablet Take 1 tablet (5 mg total) by mouth every evening. 04/14/21  Yes Raspet, Erin K, PA-C  lisinopril (ZESTRIL) 40 MG tablet Take 1 tablet (40 mg total) by mouth daily. 01/10/22  Yes Ivonne Andrew, NP  loratadine (CLARITIN) 10 MG tablet Take by mouth. 05/25/20  Yes [provider]  nystatin-triamcinolone (MYCOLOG II) cream Apply to affected area bid x 7-10 days 07/17/22  Yes Rodriguez-Southworth, Nettie Elm, PA-C  ranitidine (ZANTAC) 150 MG tablet Take 1 tablet (150 mg total) by mouth 2 (two) times daily. 03/30/17  Yes Domenick Gong, MD  RESTASIS 0.05 % ophthalmic emulsion Place 1 drop into the right eye 2 (two) times daily. 11/08/20  Yes [provider]  glipiZIDE (GLUCOTROL) 10 MG tablet Take 1 tablet (10 mg total) by mouth in the morning and at bedtime. 02/02/23   Wynonia Lawman A, NP  loratadine (CLARITIN) 10 MG tablet Take 1 tablet (10 mg total) by mouth daily. 07/20/21 10/18/21  Ivonne Andrew, NP  metFORMIN (GLUCOPHAGE) 500 MG tablet Take 1 tablet (500 mg total) by mouth 3 (three) times daily before meals. 02/02/23   Wynonia Lawman  A, NP  omeprazole (PRILOSEC) 20 MG capsule Take 1 capsule (20 mg total) by mouth daily. 03/29/17 04/28/17  Leath-Warren, Sadie Haber, NP    Family History Family History  Problem Relation Age of Onset   Diabetes Sister     Social History Social History   Tobacco Use   Smoking status: Never   Smokeless tobacco: Never  Substance Use Topics   Alcohol use: Yes    Comment: rarely: 2-4 times per month   Drug use: No     Allergies   Patient has no known  allergies.   Review of Systems Review of Systems  Constitutional:  Negative for chills, fatigue and fever.  HENT:  Positive for congestion and sore throat.   Respiratory:  Positive for cough. Negative for shortness of breath.   Cardiovascular:  Negative for chest pain.  Endocrine: Negative for polydipsia, polyphagia and polyuria.  Neurological:  Negative for dizziness and light-headedness.     Physical Exam Triage Vital Signs ED Triage Vitals  Encounter Vitals Group     BP 02/02/23 1714 (!) 152/98     Systolic BP Percentile --      Diastolic BP Percentile --      Pulse Rate 02/02/23 1714 72     Resp 02/02/23 1714 18     Temp 02/02/23 1714 98.1 F (36.7 C)     Temp src --      SpO2 02/02/23 1714 95 %     Weight --      Height --      Head Circumference --      Peak Flow --      Pain Score 02/02/23 1709 9     Pain Loc --      Pain Education --      Exclude from Growth Chart --    No data found.  Updated Vital Signs BP (!) 152/98   Pulse 72   Temp 98.1 F (36.7 C)   Resp 18   SpO2 95%   Visual Acuity Right Eye Distance:   Left Eye Distance:   Bilateral Distance:    Right Eye Near:   Left Eye Near:    Bilateral Near:     Physical Exam Vitals and nursing note reviewed.  Constitutional:      General: He is awake. He is not in acute distress.    Appearance: Normal appearance. He is well-developed and well-groomed. He is not ill-appearing.  HENT:     Right Ear: Tympanic membrane is erythematous and bulging.     Left Ear: Tympanic membrane is erythematous and bulging.     Nose: Congestion and rhinorrhea present.     Mouth/Throat:     Mouth: Mucous membranes are moist.     Pharynx: Posterior oropharyngeal erythema present. No oropharyngeal exudate.     Tonsils: No tonsillar exudate.  Cardiovascular:     Rate and Rhythm: Normal rate.  Pulmonary:     Effort: Pulmonary effort is normal.     Breath sounds: Normal breath sounds.  Skin:    General: Skin is  warm and dry.  Neurological:     Mental Status: He is alert.  Psychiatric:        Behavior: Behavior is cooperative.      UC Treatments / Results  Labs (all labs ordered are listed, but only abnormal results are displayed) Labs Reviewed  BASIC METABOLIC PANEL - Abnormal; Notable for the following components:      Result Value  Glucose, Bld 401 (*)    All other components within normal limits  HEMOGLOBIN A1C - Abnormal; Notable for the following components:   Hgb A1c MFr Bld 11.6 (*)    All other components within normal limits  POCT FASTING CBG [facility] MANUAL ENTRY - Abnormal; Notable for the following components:   POCT Glucose ([facility]) 419 (*)    All other components within normal limits    EKG   Radiology No results found.  Procedures Procedures (including critical care time)  Medications Ordered in UC Medications - No data to display  Initial Impression / Assessment and Plan / UC Course  I have reviewed the triage vital signs and the nursing notes.  Pertinent labs & imaging results that were available during my care of the patient were reviewed by me and considered in my medical decision making (see chart for details).     Patient presented with 10-day history of sore throat, congestion, cough.  Patient also reports running out of diabetes medications and states that he is controlling his diabetes with exercise.  Upon assessment patient has erythematous and bulging TMs, congestion and rhinorrhea present, erythema noted to pharynx.  CBG in clinic was 419.  Ordered BMP and hemoglobin A1c for further evaluation of diabetes.  A1c was 11.6.  Prescribed Augmentin for bilateral ear infection.  Discussed the importance of following up with primary care doctor for further management of diabetes.  Discussed return and emergency department precautions. Final Clinical Impressions(s) / UC Diagnoses   Final diagnoses:  Non-recurrent acute suppurative otitis media of both ears  without spontaneous rupture of tympanic membranes  Medication refill  Hyperglycemia     Discharge Instructions      I have prescribed Augmentin for ear infection, you will take this twice daily for 7 days.  I have refilled your prescriptions for 30 days for metformin and glipizide.  Your blood work results will come back over the next few days and someone will call if results are abnormal.  Please follow-up with your primary care doctor for further management of your diabetes.  Return here as needed.  Le recet Augmentin para la infeccin de odo; deber [employer] al da durante [address] [place].  He resurtido sus recetas por [address] de metformina y [place].  Los resultados de sus anlisis de sangre estarn disponibles en los prximos das y alguien lo llamar si los resultados son anormales.  Haga un seguimiento con su mdico de atencin primaria para un mayor control de su diabetes.  Regrese aqu segn sea necesario.    ED Prescriptions     Medication Sig Dispense Auth. Provider   amoxicillin-clavulanate (AUGMENTIN) 875-125 MG tablet Take 1 tablet by mouth every 12 (twelve) hours. 14 tablet Susann Givens, Alb[place] Weatherholtz A, NP   glipiZIDE (GLUCOTROL) 10 MG tablet Take 1 tablet (10 mg total) by mouth in the morning and at bedtime. 60 tablet Wynonia Lawman A, NP   metFORMIN (GLUCOPHAGE) 500 MG tablet Take 1 tablet (500 mg total) by mouth 3 (three) times daily before meals. 90 tablet Wynonia Lawman A, NP      PDMP not reviewed this encounter.   Wynonia Lawman A, NP 02/03/23 1027

## 2023-03-23 ENCOUNTER — Ambulatory Visit (HOSPITAL_COMMUNITY)
Admission: EM | Admit: 2023-03-23 | Discharge: 2023-03-23 | Disposition: A | Discharge status: Home / Self Care | Attending: Family Medicine | Admitting: Family Medicine

## 2023-03-23 ENCOUNTER — Encounter (HOSPITAL_COMMUNITY): Payer: Self-pay

## 2023-03-23 DIAGNOSIS — Z7985 Long-term (current) use of injectable non-insulin antidiabetic drugs: Secondary | ICD-10-CM | POA: Diagnosis not present

## 2023-03-23 DIAGNOSIS — E119 Type 2 diabetes mellitus without complications: Secondary | ICD-10-CM | POA: Diagnosis not present

## 2023-03-23 HISTORY — DX: Essential (primary) hypertension: I10

## 2023-03-23 HISTORY — DX: Type 2 diabetes mellitus without complications: E11.9

## 2023-03-23 HISTORY — DX: Hyperlipidemia, unspecified: E78.5

## 2023-03-23 MED ORDER — METFORMIN HCL 500 MG PO TABS
500.0000 mg | ORAL_TABLET | Freq: Three times a day (TID) | ORAL | 0 refills | Status: DC
Start: 2023-03-23 — End: 2023-04-18

## 2023-03-23 MED ORDER — GLIPIZIDE 10 MG PO TABS
10.0000 mg | ORAL_TABLET | Freq: Two times a day (BID) | ORAL | 0 refills | Status: DC
Start: 2023-03-23 — End: 2023-04-18

## 2023-03-23 NOTE — Discharge Instructions (Signed)
 Continue your medications(Contine tomando sus medicamentos):   Glipizide 10 milligrams--1 tablet 2 times daily. (Glipizida 10 miligramos: 1 comprimido 2 veces al da)  Metformin 500 mg--1 tablet 3 times daily with meals. (Metformina 500 mg: 1 comprimido 3 veces al da con las comidas.)   Please keep your appointment with your primary care for April 2. (Por favor, no falte a su cita con su mdico de cabecera para el 2 de abril.)

## 2023-03-23 NOTE — ED Triage Notes (Signed)
 Patient needing refill of Glipizide and Metformin. States he ran out around a month ago. Scheduled to see his PCP  is 04/18/23.

## 2023-03-23 NOTE — ED Provider Notes (Signed)
 [facility]    CSN: [id number] Arrival date & time: 03/23/23  4098      History   Chief Complaint Chief Complaint  Patient presents with   Medication Refill    HPI Allen Rose is a 48 y.o. male.    Medication Refill Here for diabetes.  He has been taking glipizide 10 mg twice daily and metformin 500 mg 3 times daily.  Staff understood him to say he had run out about 1 month ago.  To me he states he ran out yesterday.  He does not check his sugars at home.    He does not have any chest pain or shortness of breath.  No polydipsia or polyphagia or polyuria.    He does occasionally have some oral pain.    He will be following up with his primary care on April 2.  He had not gotten to see them for a while due to his not having any medical coverage but now he does and he will see them within the next 30 days.    He also has a dentist that he has an appointment with in May.  NKDA Past Medical History:  Diagnosis Date   Diabetes mellitus without complication (HCC)    Hyperlipidemia    Hypertension     Patient Active Problem List   Diagnosis Date Noted   Persistent hoarseness 04/04/2017   Viral URI with cough 02/14/2017   Trapezius muscle spasm 01/26/2016   Type 2 diabetes mellitus (HCC) 10/01/2014   Metabolic syndrome 04/27/2014   Nonspecific elevation of level of transaminase or lactic acid dehydrogenase (LDH) 03/04/2014   Blurred vision, right eye 02/27/2014   Thyroiditis, acute nonsuppurative 07/14/2013   Dysuria 07/11/2013   Acanthosis nigricans 07/11/2013   Dental caries 02/01/2013   Conjunctivitis 08/18/2012   GERD 08/05/2007   FATIGUE 08/05/2007   Common wart 07/15/2007   VENEREAL WART 07/15/2007   EXOGENOUS OBESITY 07/15/2007   DEPRESSIVE DISORDER NOT ELSEWHERE CLASSIFIED 07/15/2007    History reviewed. No pertinent surgical history.     Home Medications    Prior to Admission medications   Medication Sig Start Date End Date  Taking? Authorizing Provider  acyclovir (ZOVIRAX) 800 MG tablet Take 800 mg by mouth daily. 11/05/20  Yes [provider]  atorvastatin (LIPITOR) 40 MG tablet Take 1 tablet (40 mg total) by mouth daily. 01/10/22  Yes Ivonne Andrew, NP  Blood Glucose Monitoring Suppl (GHT BLOOD GLUCOSE MONITOR) w/Device KIT See admin instructions. 05/27/20  Yes [provider]  glipiZIDE (GLUCOTROL) 10 MG tablet Take 1 tablet (10 mg total) by mouth in the morning and at bedtime. 03/23/23   Zenia Resides, MD  metFORMIN (GLUCOPHAGE) 500 MG tablet Take 1 tablet (500 mg total) by mouth 3 (three) times daily before meals. 03/23/23   Zenia Resides, MD  RESTASIS 0.05 % ophthalmic emulsion Place 1 drop into the right eye 2 (two) times daily. 11/08/20  Yes [provider]    Family History Family History  Problem Relation Age of Onset   Diabetes Sister     Social History Social History   Tobacco Use   Smoking status: Never   Smokeless tobacco: Never  Vaping Use   Vaping status: Never Used  Substance Use Topics   Alcohol use: Yes    Comment: rarely: 2-4 times per month   Drug use: No     Allergies   Patient has no known allergies.   Review of Systems  Review of Systems   Physical Exam Triage Vital Signs ED Triage Vitals  Encounter Vitals Group     BP 03/23/23 0832 (!) 136/91     Systolic BP Percentile --      Diastolic BP Percentile --      Pulse Rate 03/23/23 0832 65     Resp 03/23/23 0832 16     Temp 03/23/23 0832 97.6 F (36.4 C)     Temp Source 03/23/23 0832 Oral     SpO2 03/23/23 0832 98 %     Weight 03/23/23 0832 170 lb 6.7 oz (77.3 kg)     Height 03/23/23 0832 5\' 4"  (1.626 m)     Head Circumference --      Peak Flow --      Pain Score 03/23/23 0829 0     Pain Loc --      Pain Education --      Exclude from Growth Chart --    No data found.  Updated Vital Signs BP (!) 136/91 (BP Location: Right Arm)   Pulse 65   Temp 97.6 F (36.4 C)  (Oral)   Resp 16   Ht 5\' 4"  (1.626 m)   Wt 77.3 kg   SpO2 98%   BMI 29.25 kg/m   Visual Acuity Right Eye Distance:   Left Eye Distance:   Bilateral Distance:    Right Eye Near:   Left Eye Near:    Bilateral Near:     Physical Exam Vitals reviewed.  Constitutional:      General: He is not in acute distress.    Appearance: He is not ill-appearing, toxic-appearing or diaphoretic.  HENT:     Nose: Nose normal.     Mouth/Throat:     Mouth: Mucous membranes are moist.     Comments: Dental caries is noted in the oral cavity.  No swelling or erythema Eyes:     Extraocular Movements: Extraocular movements intact.     Conjunctiva/sclera: Conjunctivae normal.     Pupils: Pupils are equal, round, and reactive to light.  Cardiovascular:     Rate and Rhythm: Normal rate and regular rhythm.     Heart sounds: No murmur heard. Pulmonary:     Effort: Pulmonary effort is normal. No respiratory distress.     Breath sounds: Normal breath sounds. No stridor. No wheezing, rhonchi or rales.  Musculoskeletal:     Cervical back: Neck supple.  Lymphadenopathy:     Cervical: No cervical adenopathy.  Skin:    Coloration: Skin is not jaundiced or pale.  Neurological:     General: No focal deficit present.     Mental Status: He is alert and oriented to person, place, and time.  Psychiatric:        Behavior: Behavior normal.      UC Treatments / Results  Labs (all labs ordered are listed, but only abnormal results are displayed) Labs Reviewed - No data to display  EKG   Radiology No results found.  Procedures Procedures (including critical care time)  Medications Ordered in UC Medications - No data to display  Initial Impression / Assessment and Plan / UC Course  I have reviewed the triage vital signs and the nursing notes.  Pertinent labs & imaging results that were available during my care of the patient were reviewed by me and considered in my medical decision making (see  chart for details).      Visit was conducted in [place] and [place].  I  have refilled his glipizide and metformin for 1 month supply.  Patient asked for a 20-month supply.  I do not see the need for this since he is going to see his primary care on April 2 so I declined to do that. I have encouraged him to also see his dentist when he has that appointment.  Final Clinical Impressions(s) / UC Diagnoses   Final diagnoses:  Type 2 diabetes mellitus without complication, without long-term current use of insulin (HCC)     Discharge Instructions      Continue your medications(Contine tomando sus medicamentos):   Glipizide 10 milligrams--1 tablet 2 times daily. (Glipizida 10 miligramos: 1 comprimido 2 veces al da)  Metformin 500 mg--1 tablet 3 times daily with meals. (Metformina 500 mg: 1 comprimido 3 veces al da con las comidas.)   Please keep your appointment with your primary care for April 2. (Por favor, no falte a su cita con su mdico de cabecera para el 2 de abril.)     ED Prescriptions     Medication Sig Dispense Auth. Provider   glipiZIDE (GLUCOTROL) 10 MG tablet Take 1 tablet (10 mg total) by mouth in the morning and at bedtime. 60 tablet Stepahnie Campo, Janace Aris, MD   metFORMIN (GLUCOPHAGE) 500 MG tablet Take 1 tablet (500 mg total) by mouth 3 (three) times daily before meals. 90 tablet Lonnell Chaput, Janace Aris, MD      PDMP not reviewed this encounter.   Zenia Resides, MD 03/23/23 [phone]

## 2023-04-18 ENCOUNTER — Ambulatory Visit (INDEPENDENT_AMBULATORY_CARE_PROVIDER_SITE_OTHER): Payer: Self-pay | Admitting: Nurse Practitioner

## 2023-04-18 ENCOUNTER — Encounter: Payer: Self-pay | Admitting: Nurse Practitioner

## 2023-04-18 VITALS — BP 145/81 | HR 61 | Temp 98.0°F | Wt 182.0 lb

## 2023-04-18 DIAGNOSIS — E119 Type 2 diabetes mellitus without complications: Secondary | ICD-10-CM

## 2023-04-18 DIAGNOSIS — J302 Other seasonal allergic rhinitis: Secondary | ICD-10-CM

## 2023-04-18 DIAGNOSIS — E785 Hyperlipidemia, unspecified: Secondary | ICD-10-CM | POA: Diagnosis not present

## 2023-04-18 DIAGNOSIS — Z13220 Encounter for screening for lipoid disorders: Secondary | ICD-10-CM | POA: Diagnosis not present

## 2023-04-18 DIAGNOSIS — Z0000 Encounter for general adult medical examination without abnormal findings: Secondary | ICD-10-CM | POA: Diagnosis not present

## 2023-04-18 LAB — POCT GLYCOSYLATED HEMOGLOBIN (HGB A1C): Hemoglobin A1C: 8.8 % — AB (ref 4.0–5.6)

## 2023-04-18 MED ORDER — METFORMIN HCL 500 MG PO TABS: 500.0000 mg | ORAL_TABLET | Freq: Three times a day (TID) | ORAL | 0 refills | Status: DC

## 2023-04-18 MED ORDER — ACYCLOVIR 800 MG PO TABS: 800.0000 mg | ORAL_TABLET | Freq: Every day | ORAL | 1 refills | Status: AC

## 2023-04-18 MED ORDER — GLIPIZIDE 10 MG PO TABS: 10.0000 mg | ORAL_TABLET | Freq: Two times a day (BID) | ORAL | 0 refills | Status: DC

## 2023-04-18 MED ORDER — CETIRIZINE HCL 10 MG PO TABS
10.0000 mg | ORAL_TABLET | Freq: Every day | ORAL | 11 refills | Status: DC
Start: 2023-04-18 — End: 2023-07-25

## 2023-04-18 MED ORDER — ATORVASTATIN CALCIUM 40 MG PO TABS: 40.0000 mg | ORAL_TABLET | Freq: Every day | ORAL | 1 refills | Status: DC

## 2023-04-18 MED ORDER — METFORMIN HCL 1000 MG PO TABS
1000.0000 mg | ORAL_TABLET | Freq: Two times a day (BID) | ORAL | 3 refills | Status: DC
Start: 2023-04-18 — End: 2023-07-25

## 2023-04-18 NOTE — Progress Notes (Signed)
 Subjective   Patient ID: Allen Rose, male    DOB: 04-21-1975, 48 y.o.   MRN: [id number]  Chief Complaint  Patient presents with   Medical Management of Chronic Issues   Diabetes   Annual Exam    Referring provider: Ivonne Andrew, NP  [name] is a 48 y.o. male with Past Medical History: No date: Diabetes mellitus without complication (HCC) No date: Hyperlipidemia No date: Hypertension   HPI  48 year old male with history of type 2 diabetes and GERD.   Patient presents today for a diabetes follow-up and physical. Patient A1C is 8.8 which is improved since last visit. Will start better diet and exercise. Will increase metformin. Requesting allergy medication. Denies f/c/s, n/v/d, hemoptysis, PND, leg swelling. Denies chest pain or edema.      No Known Allergies  Immunization History  Administered Date(s) Administered   Influenza,inj,Quad PF,6+ Mos 11/18/2012, 09/29/2014, 01/25/2016, 10/11/2016, 12/06/2017   Pneumococcal Polysaccharide-23 09/29/2014   Td 03/16/2001   Tdap 09/29/2014    Tobacco History: Social History   Tobacco Use  Smoking Status Never  Smokeless Tobacco Never   Counseling given: Not Answered   Outpatient Encounter Medications as of 04/18/2023  Medication Sig   cetirizine (ZYRTEC) 10 MG tablet Take 1 tablet (10 mg total) by mouth daily.   metFORMIN (GLUCOPHAGE) 1000 MG tablet Take 1 tablet (1,000 mg total) by mouth 2 (two) times daily with a meal.   acyclovir (ZOVIRAX) 800 MG tablet Take 1 tablet (800 mg total) by mouth daily.   atorvastatin (LIPITOR) 40 MG tablet Take 1 tablet (40 mg total) by mouth daily.   Blood Glucose Monitoring Suppl (GHT BLOOD GLUCOSE MONITOR) w/Device KIT See admin instructions.   glipiZIDE (GLUCOTROL) 10 MG tablet Take 1 tablet (10 mg total) by mouth in the morning and at bedtime.   RESTASIS 0.05 % ophthalmic emulsion Place 1 drop into the right eye 2 (two) times daily.   [DISCONTINUED] acyclovir  (ZOVIRAX) 800 MG tablet Take 800 mg by mouth daily.   [DISCONTINUED] atorvastatin (LIPITOR) 40 MG tablet Take 1 tablet (40 mg total) by mouth daily.   [DISCONTINUED] glipiZIDE (GLUCOTROL) 10 MG tablet Take 1 tablet (10 mg total) by mouth in the morning and at bedtime.   [DISCONTINUED] metFORMIN (GLUCOPHAGE) 500 MG tablet Take 1 tablet (500 mg total) by mouth 3 (three) times daily before meals.   [DISCONTINUED] metFORMIN (GLUCOPHAGE) 500 MG tablet Take 1 tablet (500 mg total) by mouth 3 (three) times daily before meals.   No facility-administered encounter medications on file as of 04/18/2023.    Review of Systems  Review of Systems  Constitutional: Negative.   HENT: Negative.    Cardiovascular: Negative.   Gastrointestinal: Negative.   Allergic/Immunologic: Negative.   Neurological: Negative.   Psychiatric/Behavioral: Negative.       Objective:   BP (!) 145/81   Pulse 61   Temp 98 F (36.7 C) (Oral)   Wt 182 lb (82.6 kg)   SpO2 97%   BMI 31.24 kg/m   Wt Readings from Last 5 Encounters:  04/18/23 182 lb (82.6 kg)  03/23/23 170 lb 6.7 oz (77.3 kg)  07/17/22 170 lb 6.4 oz (77.3 kg)  12/21/21 182 lb 4 oz (82.7 kg)  11/03/21 178 lb 12.8 oz (81.1 kg)     Physical Exam Vitals and nursing note reviewed.  Constitutional:      General: He is not in acute distress.    Appearance: He is well-developed.  Cardiovascular:  Rate and Rhythm: Normal rate and regular rhythm.  Pulmonary:     Effort: Pulmonary effort is normal.     Breath sounds: Normal breath sounds.  Skin:    General: Skin is warm and dry.  Neurological:     Mental Status: He is alert and oriented to person, place, and time.       Assessment & Plan:   Type 2 diabetes mellitus without complication, without long-term current use of insulin (HCC) -     POCT glycosylated hemoglobin (Hb A1C) -     Microalbumin / creatinine urine ratio -     CBC -     Comprehensive metabolic panel with GFR -     Lipid  panel -     glipiZIDE; Take 1 tablet (10 mg total) by mouth in the morning and at bedtime.  Dispense: 60 tablet; Refill: 0 -     metFORMIN HCl; Take 1 tablet (1,000 mg total) by mouth 2 (two) times daily with a meal.  Dispense: 180 tablet; Refill: 3  Routine adult health maintenance -     CBC -     Comprehensive metabolic panel with GFR  Lipid screening -     Lipid panel  Hyperlipidemia, unspecified hyperlipidemia type -     Atorvastatin Calcium; Take 1 tablet (40 mg total) by mouth daily.  Dispense: 90 tablet; Refill: 1  Seasonal allergies -     Cetirizine HCl; Take 1 tablet (10 mg total) by mouth daily.  Dispense: 30 tablet; Refill: 11  Other orders -     Acyclovir; Take 1 tablet (800 mg total) by mouth daily.  Dispense: 90 tablet; Refill: 1     Return in about 3 months (around 07/18/2023).   Ivonne Andrew, NP 04/18/2023

## 2023-04-18 NOTE — Patient Instructions (Signed)
 1. Type 2 diabetes mellitus without complication, without long-term current use of insulin (HCC) (Primary)  - POCT glycosylated hemoglobin (Hb A1C) - Urine Albumin/Creatinine with ratio (send out) [LAB689] - CBC - Comprehensive metabolic panel with GFR - Lipid Panel - glipiZIDE (GLUCOTROL) 10 MG tablet; Take 1 tablet (10 mg total) by mouth in the morning and at bedtime.  Dispense: 60 tablet; Refill: 0 - metFORMIN (GLUCOPHAGE) 1000 MG tablet; Take 1 tablet (1,000 mg total) by mouth 2 (two) times daily with a meal.  Dispense: 180 tablet; Refill: 3  2. Routine adult health maintenance  - CBC - Comprehensive metabolic panel with GFR  3. Lipid screening  - Lipid Panel  4. Hyperlipidemia, unspecified hyperlipidemia type  - atorvastatin (LIPITOR) 40 MG tablet; Take 1 tablet (40 mg total) by mouth daily.  Dispense: 90 tablet; Refill: 1

## 2023-04-19 ENCOUNTER — Other Ambulatory Visit: Payer: Self-pay | Admitting: Nurse Practitioner

## 2023-04-19 DIAGNOSIS — E785 Hyperlipidemia, unspecified: Secondary | ICD-10-CM

## 2023-04-19 LAB — LIPID PANEL
Chol/HDL Ratio: 7.2 ratio — ABNORMAL HIGH (ref 0.0–5.0)
Cholesterol, Total: 245 mg/dL — ABNORMAL HIGH (ref 100–199)
HDL: 34 mg/dL — ABNORMAL LOW (ref >39)
Triglycerides: 1054 mg/dL (ref 0–149)

## 2023-04-19 LAB — COMPREHENSIVE METABOLIC PANEL WITH GFR
ALT: 58 IU/L — ABNORMAL HIGH (ref 0–44)
AST: 38 IU/L (ref 0–40)
Albumin: 4.1 g/dL (ref 4.1–5.1)
Alkaline Phosphatase: 97 IU/L (ref 44–121)
BUN/Creatinine Ratio: 22 — ABNORMAL HIGH (ref 9–20)
BUN: 15 mg/dL (ref 6–24)
Bilirubin Total: <0.2 mg/dL (ref 0.0–1.2)
CO2: 19 mmol/L — ABNORMAL LOW (ref 20–29)
Calcium: 9.2 mg/dL (ref 8.7–10.2)
Chloride: 101 mmol/L (ref 96–106)
Creatinine, Ser: 0.69 mg/dL — ABNORMAL LOW (ref 0.76–1.27)
Globulin, Total: 2.2 g/dL (ref 1.5–4.5)
Glucose: 154 mg/dL — ABNORMAL HIGH (ref 70–99)
Potassium: 3.9 mmol/L (ref 3.5–5.2)
Sodium: 138 mmol/L (ref 134–144)
Total Protein: 6.3 g/dL (ref 6.0–8.5)
eGFR: 116 mL/min/{1.73_m2} (ref >59)

## 2023-04-19 LAB — CBC
Hematocrit: 42.7 % (ref 37.5–51.0)
Hemoglobin: 14.4 g/dL (ref 13.0–17.7)
MCH: 33.1 pg — ABNORMAL HIGH (ref 26.6–33.0)
MCHC: 33.7 g/dL (ref 31.5–35.7)
MCV: 98 fL — ABNORMAL HIGH (ref 79–97)
Platelets: 242 10*3/uL (ref 150–450)
RBC: 4.35 x10E6/uL (ref 4.14–5.80)
RDW: 13.4 % (ref 11.6–15.4)
WBC: 5.9 10*3/uL (ref 3.4–10.8)

## 2023-04-19 LAB — MICROALBUMIN / CREATININE URINE RATIO
Creatinine, Urine: 54.5 mg/dL
Microalb/Creat Ratio: 6 mg/g{creat} (ref 0–29)
Microalbumin, Urine: 3.1 ug/mL

## 2023-04-19 MED ORDER — OMEGA-3-ACID ETHYL ESTERS 1 G PO CAPS
2.0000 g | ORAL_CAPSULE | Freq: Two times a day (BID) | ORAL | 2 refills | Status: DC
Start: 2023-04-19 — End: 2023-07-25

## 2023-04-23 ENCOUNTER — Other Ambulatory Visit: Payer: Self-pay

## 2023-04-23 DIAGNOSIS — Z79899 Other long term (current) drug therapy: Secondary | ICD-10-CM

## 2023-04-23 NOTE — Progress Notes (Signed)
 04/23/2023 Name: Allen Rose MRN: [id number] DOB: 1975-05-02  Chief Complaint  Patient presents with   Hyperlipidemia   Diabetes    Allen Rose is a 48 y.o. year old male who presented for a telephone visit.   They were referred to the pharmacist by their PCP for assistance in managing hyperlipidemia.    Subjective:  Care Team: Primary Care Provider: Jerrlyn Morel, NP ; Next Scheduled Visit: 07/25/2023   Medication Access/Adherence  Current Pharmacy:  [facility] [address] [place] - [address] [place] [address] Phone: [phone] Fax: [phone]   Patient reports affordability concerns with their medications: No  He reports that insurance covers more than half of medication cost. Confirmed patient paid a lot for Lov[employer] (a significant amount) but he states everythingis okay right now.  Patient reports access/transportation concerns to their pharmacy:  Unable to answer as call dropped Patient reports adherence concerns with their medications:  Unable to answer as call dropped      Objective:  Lab Results  Component Value Date   HGBA1C 8.8 (A) 04/18/2023    Lab Results  Component Value Date   CREATININE 0.69 (L) 04/18/2023   BUN 15 04/18/2023   NA 138 04/18/2023   K 3.9 04/18/2023   CL 101 04/18/2023   CO2 19 (L) 04/18/2023    Lab Results  Component Value Date   CHOL 245 (H) 04/18/2023   HDL 34 (L) 04/18/2023   LDLCALC Comment (A) 04/18/2023   LDLDIRECT 100 (H) 06/26/2014   TRIG 1,054 (HH) 04/18/2023   CHOLHDL 7.2 (H) 04/18/2023    Medications Reviewed Today     Reviewed by Amedeo Bailiff, RPH (Pharmacist) on 04/23/23 at 1559  Med List Status: <None>   Medication Order Taking? Sig Documenting Provider Last Dose Status Informant  acyclovir (ZOVIRAX) 800 MG tablet [id number] Yes Take 1 tablet (800 mg total) by mouth daily. Jerrlyn Morel, NP Taking Active   atorvastatin  (LIPITOR) 40 MG tablet [id number] Yes Take 1 tablet (40 mg total) by mouth daily. Jerrlyn Morel, NP Taking Active   Blood Glucose Monitoring Suppl (GHT BLOOD GLUCOSE MONITOR) w/Device KIT [id number]  See admin instructions. [provider]  Active   cetirizine (ZYRTEC) 10 MG tablet [id number] Yes Take 1 tablet (10 mg total) by mouth daily. Jerrlyn Morel, NP Taking Active            Med Note Vallarie Gauze, [facility] R   Mon Apr 23, 2023  3:52 PM) Taking PRN   glipiZIDE (GLUCOTROL) 10 MG tablet [id number] Yes Take 1 tablet (10 mg total) by mouth in the morning and at bedtime. Jerrlyn Morel, NP Taking Active   ketorolac (ACULAR) 0.5 % ophthalmic solution [id number] Yes 1 drop daily. [provider] Taking Active            Med Note Vallarie Gauze, [facility] R   Mon Apr 23, 2023  3:54 PM) Taking once daily PRN   metFORMIN (GLUCOPHAGE) 1000 MG tablet [id number] No Take 1 tablet (1,000 mg total) by mouth 2 (two) times daily with a meal.  Patient not taking: Reported on 04/23/2023   Jerrlyn Morel, NP Not Taking Active            Med Note Vallarie Gauze, [facility] R   Mon Apr 23, 2023  3:57 PM) 500 mg "three tablets during the day"  omega-3 acid ethyl esters (LOVAZA) 1 g capsule [id number] Yes Take 2 capsules (  2 g total) by mouth 2 (two) times daily. Jerrlyn Morel, NP Taking Active            Med Note Vallarie Gauze, [facility] R   Mon Apr 23, 2023  3:59 PM) He is at work but $500 to $800  RESTASIS 0.05 % ophthalmic emulsion [id number] Yes Place 1 drop into the right eye 2 (two) times daily. [provider] Taking Active               Assessment/Plan:   Patient was at work when I called, and the call was disconnected. The interpreter attempted to return the call to Allen Rose, but there was no answer, so a voicemail was left.    Follow Up Plan: Rescheduled for 05/08/2023  Alexandria Angel, PharmD Clinical Pharmacist [facility] Cell: [phone]

## 2023-04-26 ENCOUNTER — Telehealth: Payer: Self-pay | Admitting: *Deleted

## 2023-04-26 NOTE — Progress Notes (Unsigned)
 Care Guide Pharmacy Note  04/26/2023 Name: Allen Rose MRN: [id number] DOB: [birth year]  Referred By: Ivonne Andrew, NP Reason for referral: No chief complaint on file.   Allen Rose is a 48 y.o. year old male who is a primary care patient of Ivonne Andrew, NP.  Allen Rose was referred to the pharmacist for assistance related to: HLD  A second unsuccessful telephone outreach was attempted today to contact the patient who was referred to the pharmacy team for assistance with medication management. Additional attempts will be made to contact the patient.  Gwenevere Ghazi  [facility]  [facility], [facility] Guide  Direct Dial: [phone]  Fax [phone]

## 2023-04-27 NOTE — Progress Notes (Signed)
 Care Guide Pharmacy Note  04/27/2023 Name: Allen Rose MRN: [id number] DOB: 06/25/1975  Referred By: Ivonne Andrew, NP Reason for referral: Complex Care Management (Initial outreach to schedule referral with PharmD Para March))   Allen Rose is a 48 y.o. year old male who is a primary care patient of Ivonne Andrew, NP.  Allen Rose was referred to the pharmacist for assistance related to: HLD  Successful contact was made with the patient to discuss pharmacy services including being ready for the pharmacist to call at least 5 minutes before the scheduled appointment time and to have medication bottles and any blood pressure readings ready for review. The patient agreed to meet with the pharmacist via telephone visit on (date/time). 05/08/23 at 9:00 AM   [facility]  [facility], [facility] Guide  Direct Dial: [phone]  Fax [phone]

## 2023-05-08 ENCOUNTER — Other Ambulatory Visit: Payer: Self-pay

## 2023-05-08 ENCOUNTER — Telehealth: Payer: Self-pay

## 2023-05-08 DIAGNOSIS — Z79899 Other long term (current) drug therapy: Secondary | ICD-10-CM

## 2023-05-08 NOTE — Progress Notes (Signed)
   05/08/2023 Name: Trejuan Matherne MRN: [id number] DOB: 1975-08-01  No chief complaint on file.   Caitlin Reyes-Menjivar is a 48 y.o. year old male who presented for a telephone visit.   They were referred to the pharmacist by their PCP for assistance in specifically managing hyperlipidemia.   Attempted to contact patient for medication management/review. Left HIPAA compliant message, via interpreter, for patient to return my call at their convenience.   Second attempt for patient outreach.   Thank you for allowing pharmacy to be a part of this patient's care.  Alexandria Angel, PharmD Clinical Pharmacist Cell: [phone]   Alexandria Angel, PharmD Clinical Pharmacist [facility] Cell: [phone]

## 2023-06-12 ENCOUNTER — Telehealth: Payer: Self-pay | Admitting: *Deleted

## 2023-06-12 NOTE — Progress Notes (Unsigned)
 Complex Care Management Care Guide Note  06/12/2023 Name: Allen Rose MRN: [id number] DOB: [birth year]  Allen Rose is a 48 y.o. year old male who is a primary care patient of Jerrlyn Morel, NP and is actively engaged with the care management team. I reached out to [facility] Reyes-Menjivar by phone today using [employer] [phone] named Autry Legions to assist with re-scheduling  with the Pharmacist.  Follow up plan: Unsuccessful telephone outreach attempt made. A HIPAA compliant phone message was left for the patient providing contact information and requesting a return call.  [name]  [facility]  Value[facility]Based Care Institute, [facility] Guide  Direct Dial: [phone]  Fax [phone]

## 2023-06-13 NOTE — Progress Notes (Signed)
 Complex Care Management Care Guide Note  06/13/2023 Name: Allen Rose MRN: [id number] DOB: August 17, 1975  Allen Rose is a 48 y.o. year old male who is a primary care patient of Jerrlyn Morel, NP and is actively engaged with the care management team. I reached out to [name] by phone today to assist with re-scheduling  with the Pharmacist.  Follow up plan: Unsuccessful telephone outreach attempt made. A HIPAA compliant phone message was left for the patient providing contact information and requesting a return call. No further outreach attempts will be made at this time. We have been unable to contact the patient to reschedule for complex care management services.   [name]  [facility]  Value-Based Care Institute, [facility] Guide  Direct Dial: [phone]  Fax [phone]

## 2023-06-18 ENCOUNTER — Telehealth: Payer: Self-pay

## 2023-06-18 NOTE — Telephone Encounter (Signed)
 Copied from CRM [id number]. Topic: Clinical - Lab/Test Results >> Jun 18, 2023 10:23 AM Rennis Case wrote: Reason for CRM: Pt asked about lab results, pt stated he received a letter in the mail but unable to read it as it was in [place]. Provided results, pt had no further questions.

## 2023-06-19 ENCOUNTER — Other Ambulatory Visit: Payer: Self-pay

## 2023-07-25 ENCOUNTER — Encounter: Payer: Self-pay | Admitting: Nurse Practitioner

## 2023-07-25 ENCOUNTER — Ambulatory Visit (INDEPENDENT_AMBULATORY_CARE_PROVIDER_SITE_OTHER): Payer: Self-pay | Admitting: Nurse Practitioner

## 2023-07-25 VITALS — BP 114/60 | HR 63 | Temp 98.5°F | Wt 180.0 lb

## 2023-07-25 DIAGNOSIS — E119 Type 2 diabetes mellitus without complications: Secondary | ICD-10-CM | POA: Diagnosis not present

## 2023-07-25 DIAGNOSIS — E785 Hyperlipidemia, unspecified: Secondary | ICD-10-CM | POA: Diagnosis not present

## 2023-07-25 DIAGNOSIS — J302 Other seasonal allergic rhinitis: Secondary | ICD-10-CM

## 2023-07-25 DIAGNOSIS — Z1211 Encounter for screening for malignant neoplasm of colon: Secondary | ICD-10-CM

## 2023-07-25 DIAGNOSIS — R809 Proteinuria, unspecified: Secondary | ICD-10-CM

## 2023-07-25 LAB — POCT URINALYSIS DIP (CLINITEK)
Bilirubin, UA: NEGATIVE
Glucose, UA: 500 mg/dL — AB
Ketones, POC UA: NEGATIVE mg/dL
Leukocytes, UA: NEGATIVE
Nitrite, UA: NEGATIVE
POC PROTEIN,UA: 30 — AB
Spec Grav, UA: >=1.03 — AB (ref 1.010–1.025)
Urobilinogen, UA: 0.2 U/dL
pH, UA: 5.5 (ref 5.0–8.0)

## 2023-07-25 LAB — POCT GLYCOSYLATED HEMOGLOBIN (HGB A1C): Hemoglobin A1C: 8.9 % — AB (ref 4.0–5.6)

## 2023-07-25 MED ORDER — CETIRIZINE HCL 10 MG PO TABS
10.0000 mg | ORAL_TABLET | Freq: Every day | ORAL | 11 refills | Status: AC
Start: 2023-07-25 — End: ?

## 2023-07-25 MED ORDER — OMEGA-3-ACID ETHYL ESTERS 1 G PO CAPS
2.0000 g | ORAL_CAPSULE | Freq: Two times a day (BID) | ORAL | 2 refills | Status: AC
Start: 2023-07-25 — End: ?

## 2023-07-25 MED ORDER — GLIPIZIDE 10 MG PO TABS
10.0000 mg | ORAL_TABLET | Freq: Two times a day (BID) | ORAL | 0 refills | Status: AC
Start: 2023-07-25 — End: ?

## 2023-07-25 MED ORDER — ATORVASTATIN CALCIUM 40 MG PO TABS
40.0000 mg | ORAL_TABLET | Freq: Every day | ORAL | 1 refills | Status: AC
Start: 2023-07-25 — End: ?

## 2023-07-25 MED ORDER — METFORMIN HCL 1000 MG PO TABS
1000.0000 mg | ORAL_TABLET | Freq: Two times a day (BID) | ORAL | 3 refills | Status: AC
Start: 2023-07-25 — End: ?

## 2023-07-25 NOTE — Progress Notes (Signed)
 Subjective   Patient ID: Allen Rose, male    DOB: [birth year], 48 y.o.   MRN: [id number]  Chief Complaint  Patient presents with   Medical Management of Chronic Issues    Referring provider: Oley Bascom RAMAN, NP  Allen Rose is a 48 y.o. male with Past Medical History: No date: Diabetes mellitus without complication (HCC) No date: Hyperlipidemia No date: Hypertension   HPI  48 year old male with history of type 2 diabetes and GERD.   Patient presents today for a diabetes follow-up and physical. Patient A1C is 8.9. Will start better diet and exercise. Will place referral to pharmacy for medication management. Denies f/c/s, n/v/d, hemoptysis, PND, leg swelling. Denies chest pain or edema.   Patient does complain today of UTI symptoms.  He states he has been having burning with urination.  We will check UA in office today.   No Known Allergies  Immunization History  Administered Date(s) Administered   Influenza,inj,Quad PF,6+ Mos 11/18/2012, 09/29/2014, 01/25/2016, 10/11/2016, 12/06/2017   Pneumococcal Polysaccharide-23 09/29/2014   Td 03/16/2001   Tdap 09/29/2014    Tobacco History: Social History   Tobacco Use  Smoking Status Never  Smokeless Tobacco Never   Counseling given: Not Answered   Outpatient Encounter Medications as of 07/25/2023  Medication Sig   acyclovir  (ZOVIRAX ) 800 MG tablet Take 1 tablet (800 mg total) by mouth daily.   Blood Glucose Monitoring Suppl (GHT BLOOD GLUCOSE MONITOR) w/Device KIT See admin instructions.   ketorolac (ACULAR) 0.5 % ophthalmic solution 1 drop daily.   RESTASIS 0.05 % ophthalmic emulsion Place 1 drop into the right eye 2 (two) times daily.   [DISCONTINUED] atorvastatin  (LIPITOR) 40 MG tablet Take 1 tablet (40 mg total) by mouth daily.   [DISCONTINUED] cetirizine  (ZYRTEC ) 10 MG tablet Take 1 tablet (10 mg total) by mouth daily.   [DISCONTINUED] glipiZIDE  (GLUCOTROL ) 10 MG tablet Take 1 tablet (10 mg total)  by mouth in the morning and at bedtime.   [DISCONTINUED] metFORMIN  (GLUCOPHAGE ) 1000 MG tablet Take 1 tablet (1,000 mg total) by mouth 2 (two) times daily with a meal.   [DISCONTINUED] omega-3 acid ethyl esters (LOVAZA ) 1 g capsule Take 2 capsules (2 g total) by mouth 2 (two) times daily.   atorvastatin  (LIPITOR) 40 MG tablet Take 1 tablet (40 mg total) by mouth daily.   cetirizine  (ZYRTEC ) 10 MG tablet Take 1 tablet (10 mg total) by mouth daily.   glipiZIDE  (GLUCOTROL ) 10 MG tablet Take 1 tablet (10 mg total) by mouth in the morning and at bedtime.   metFORMIN  (GLUCOPHAGE ) 1000 MG tablet Take 1 tablet (1,000 mg total) by mouth 2 (two) times daily with a meal.   omega-3 acid ethyl esters (LOVAZA ) 1 g capsule Take 2 capsules (2 g total) by mouth 2 (two) times daily.   No facility-administered encounter medications on file as of 07/25/2023.    Review of Systems  Review of Systems  Constitutional: Negative.   HENT: Negative.    Cardiovascular: Negative.   Gastrointestinal: Negative.   Allergic/Immunologic: Negative.   Neurological: Negative.   Psychiatric/Behavioral: Negative.       Objective:   BP 114/60   Pulse 63   Temp 98.5 F (36.9 C) (Oral)   Wt 180 lb (81.6 kg)   SpO2 98%   BMI 30.90 kg/m   Wt Readings from Last 5 Encounters:  07/25/23 180 lb (81.6 kg)  04/18/23 182 lb (82.6 kg)  03/23/23 170 lb 6.7 oz (77.3 kg)  07/17/22 170  lb 6.4 oz (77.3 kg)  12/21/21 182 lb 4 oz (82.7 kg)     Physical Exam Vitals and nursing note reviewed.  Constitutional:      General: He is not in acute distress.    Appearance: He is well-developed.  Cardiovascular:     Rate and Rhythm: Normal rate and regular rhythm.  Pulmonary:     Effort: Pulmonary effort is normal.     Breath sounds: Normal breath sounds.  Skin:    General: Skin is warm and dry.  Neurological:     Mental Status: He is alert and oriented to person, place, and time.       Assessment & Plan:   Screening for  colon cancer -     Cologuard  Type 2 diabetes mellitus without complication, without long-term current use of insulin (HCC) -     POCT glycosylated hemoglobin (Hb A1C) -     AMB Referral VBCI Care Management -     glipiZIDE ; Take 1 tablet (10 mg total) by mouth in the morning and at bedtime.  Dispense: 60 tablet; Refill: 0 -     metFORMIN  HCl; Take 1 tablet (1,000 mg total) by mouth 2 (two) times daily with a meal.  Dispense: 180 tablet; Refill: 3 -     POCT URINALYSIS DIP (CLINITEK)  Hyperlipidemia, unspecified hyperlipidemia type -     Omega-3-acid  Ethyl Esters; Take 2 capsules (2 g total) by mouth 2 (two) times daily.  Dispense: 60 capsule; Refill: 2 -     Atorvastatin  Calcium ; Take 1 tablet (40 mg total) by mouth daily.  Dispense: 90 tablet; Refill: 1  Seasonal allergies -     Cetirizine  HCl; Take 1 tablet (10 mg total) by mouth daily.  Dispense: 30 tablet; Refill: 11  Proteinuria, unspecified type -     Urine Culture     Return in about 3 months (around 10/25/2023).   Bascom GORMAN Borer, NP 07/25/2023

## 2023-07-26 ENCOUNTER — Telehealth: Payer: Self-pay

## 2023-07-26 NOTE — Progress Notes (Signed)
 Care Guide Pharmacy Note  07/26/2023 Name: Allen Rose MRN: [id number] DOB: October 07, 1975  Referred By: Oley Bascom RAMAN, NP Reason for referral: Complex Care Management (Outreach to schedule with Pharm d )   Allen Rose is a 48 y.o. year old male who is a primary care patient of Oley Bascom RAMAN, NP.  Allen Rose was referred to the pharmacist for assistance related to: DMII  An unsuccessful telephone outreach was attempted today to contact the patient who was referred to the pharmacy team for assistance with medication management. Additional attempts will be made to contact the patient.  Allen Rose , RMA     [facility]  Allen Rose, Allen Rose Guide  Direct Dial: [phone]  Website: [email or web address]

## 2023-07-27 ENCOUNTER — Ambulatory Visit: Payer: Self-pay | Admitting: Nurse Practitioner

## 2023-07-27 LAB — URINE CULTURE

## 2023-08-01 NOTE — Progress Notes (Unsigned)
 Care Guide Pharmacy Note  08/01/2023 Name: Allen Rose MRN: [id number] DOB: [birth year]  Referred By: Oley Bascom RAMAN, NP Reason for referral: Complex Care Management (Outreach to schedule with Pharm d )   Allen Rose is a 48 y.o. year old male who is a primary care patient of Oley Bascom RAMAN, NP.  Allen Rose was referred to the pharmacist for assistance related to: DMII  A second unsuccessful telephone outreach was attempted today to contact the patient who was referred to the pharmacy team for assistance with medication management. Additional attempts will be made to contact the patient.  [name] , RMA     [facility]  [facility], [facility] Guide  Direct Dial: [phone]  Website: [email or web address]

## 2023-08-03 NOTE — Progress Notes (Signed)
 Care Guide Pharmacy Note  08/03/2023 Name: Allen Rose MRN: [id number] DOB: 06-19-1975  Referred By: Oley Bascom RAMAN, NP Reason for referral: Complex Care Management (Outreach to schedule with Pharm d )   Allen Rose is a 48 y.o. year old male who is a primary care patient of Oley Bascom RAMAN, NP.  Allen Rose was referred to the pharmacist for assistance related to: DMII  A third unsuccessful telephone outreach was attempted today to contact the patient who was referred to the pharmacy team for assistance with medication management. The Population Health team is pleased to engage with this patient at any time in the future upon receipt of referral and should he/she be interested in assistance from the [employer] [facility] team.  Allen Rose , RMA     [facility]  [facility], [facility] Guide  Direct Dial: [phone]  Website: [email or web address]

## 2023-10-25 ENCOUNTER — Ambulatory Visit: Payer: Self-pay | Admitting: Nurse Practitioner
